# Patient Record
Sex: Female | Born: 1975 | Race: Asian | Hispanic: No | Marital: Married | Smoking: Never smoker
Health system: Southern US, Community
[De-identification: ages and names within clinical notes are randomized; demographics above are authoritative.]

## PROBLEM LIST (undated history)

## (undated) DIAGNOSIS — I10 Essential (primary) hypertension: Secondary | ICD-10-CM

## (undated) HISTORY — DX: Essential (primary) hypertension: I10

---

## 2004-08-09 ENCOUNTER — Ambulatory Visit (HOSPITAL_COMMUNITY): Admission: RE | Admit: 2004-08-09 | Discharge: 2004-08-09 | Payer: Self-pay | Admitting: Obstetrics and Gynecology

## 2004-10-23 ENCOUNTER — Inpatient Hospital Stay (HOSPITAL_COMMUNITY): Admission: AD | Admit: 2004-10-23 | Discharge: 2004-10-23 | Payer: Self-pay | Admitting: *Deleted

## 2004-10-24 ENCOUNTER — Inpatient Hospital Stay (HOSPITAL_COMMUNITY): Admission: AD | Admit: 2004-10-24 | Discharge: 2004-10-29 | Payer: Self-pay | Admitting: Obstetrics & Gynecology

## 2004-10-24 ENCOUNTER — Ambulatory Visit: Payer: Self-pay | Admitting: Obstetrics and Gynecology

## 2004-10-26 ENCOUNTER — Encounter (INDEPENDENT_AMBULATORY_CARE_PROVIDER_SITE_OTHER): Payer: Self-pay | Admitting: Specialist

## 2004-10-26 DIAGNOSIS — IMO0002 Reserved for concepts with insufficient information to code with codable children: Secondary | ICD-10-CM

## 2008-06-03 ENCOUNTER — Emergency Department (HOSPITAL_COMMUNITY): Admission: EM | Admit: 2008-06-03 | Discharge: 2008-06-03 | Payer: Self-pay | Admitting: Emergency Medicine

## 2010-04-22 ENCOUNTER — Inpatient Hospital Stay (HOSPITAL_COMMUNITY): Admission: AD | Admit: 2010-04-22 | Payer: Self-pay | Admitting: Obstetrics and Gynecology

## 2010-05-31 ENCOUNTER — Encounter (HOSPITAL_COMMUNITY)
Admission: RE | Admit: 2010-05-31 | Discharge: 2010-05-31 | Disposition: A | Discharge status: Home / Self Care | Payer: PRIVATE HEALTH INSURANCE | Source: Ambulatory Visit | Attending: Obstetrics and Gynecology | Admitting: Obstetrics and Gynecology

## 2010-05-31 DIAGNOSIS — Z01812 Encounter for preprocedural laboratory examination: Secondary | ICD-10-CM | POA: Insufficient documentation

## 2010-05-31 LAB — CBC
Hemoglobin: 10.2 g/dL — ABNORMAL LOW (ref 12.0–15.0)
MCH: 21.8 pg — ABNORMAL LOW (ref 26.0–34.0)
Platelets: 264 10*3/uL (ref 150–400)
RDW: 19.6 % — ABNORMAL HIGH (ref 11.5–15.5)

## 2010-05-31 LAB — RPR: RPR Ser Ql: NONREACTIVE

## 2010-06-01 LAB — SURGICAL PCR SCREEN: Staphylococcus aureus: NEGATIVE

## 2010-06-02 ENCOUNTER — Inpatient Hospital Stay (HOSPITAL_COMMUNITY)
Admission: AD | Admit: 2010-06-02 | Discharge: 2010-06-05 | DRG: 766 | Disposition: A | Discharge status: Home / Self Care | Payer: PRIVATE HEALTH INSURANCE | Source: Ambulatory Visit | Attending: Obstetrics and Gynecology | Admitting: Obstetrics and Gynecology

## 2010-06-02 ENCOUNTER — Other Ambulatory Visit: Payer: Self-pay | Admitting: Obstetrics and Gynecology

## 2010-06-02 DIAGNOSIS — O09529 Supervision of elderly multigravida, unspecified trimester: Secondary | ICD-10-CM | POA: Diagnosis present

## 2010-06-02 DIAGNOSIS — O34219 Maternal care for unspecified type scar from previous cesarean delivery: Secondary | ICD-10-CM | POA: Diagnosis present

## 2010-06-02 LAB — CBC
HCT: 32.7 % — ABNORMAL LOW (ref 36.0–46.0)
Hemoglobin: 10.1 g/dL — ABNORMAL LOW (ref 12.0–15.0)
MCV: 71.1 fL — ABNORMAL LOW (ref 78.0–100.0)
RBC: 4.6 MIL/uL (ref 3.87–5.11)
RDW: 19.9 % — ABNORMAL HIGH (ref 11.5–15.5)

## 2010-06-02 LAB — TYPE AND SCREEN: ABO/RH(D): B POS

## 2010-06-03 DIAGNOSIS — IMO0002 Reserved for concepts with insufficient information to code with codable children: Secondary | ICD-10-CM

## 2010-06-04 LAB — CBC
MCH: 21.7 pg — ABNORMAL LOW (ref 26.0–34.0)
RBC: 3.55 MIL/uL — ABNORMAL LOW (ref 3.87–5.11)
RDW: 19.8 % — ABNORMAL HIGH (ref 11.5–15.5)
WBC: 11.1 10*3/uL — ABNORMAL HIGH (ref 4.0–10.5)

## 2010-06-07 ENCOUNTER — Inpatient Hospital Stay
Admission: RE | Admit: 2010-06-07 | Payer: PRIVATE HEALTH INSURANCE | Source: Ambulatory Visit | Admitting: Obstetrics and Gynecology

## 2010-06-08 NOTE — Discharge Summary (Signed)
NAME:  Brittany Holt, Brittany Holt             ACCOUNT NO.:  [id number]  MEDICAL RECORD NO.:  [id number]           PATIENT TYPE:  I  LOCATION:  9115                          FACILITY:  [facility]  PHYSICIAN:  Sherron Monday, MD        DATE OF BIRTH:  Jun 12, 1975  DATE OF ADMISSION:  06/02/2010 DATE OF DISCHARGE:  06/05/2010                              DISCHARGE SUMMARY   ADMITTING DIAGNOSES:  Intrauterine pregnancy at 40 plus weeks in early labor.  The patient desires vaginal birth after cesarean after discussion of risks, benefits, and alternatives.  DISCHARGE DIAGNOSES:  Intrauterine pregnancy at 40 plus weeks in early labor.  The patient desires vaginal birth after cesarean after discussion of risks, benefits, and alternatives, delivered by repeat low transverse cesarean section.  PROCEDURE:  Repeat low transverse cesarean section.  HISTORY OF PRESENT ILLNESS:  A 35 year old G3, P1-0-1-1 at 40 plus weeks in early labor, desirous a VBAC after discussion of risks, benefits of VBAC versus repeat low section.  Her pregnancy is complicated by AMA and late prenatal care.  She states she has had good fetal movement, no loss of fluid, no vaginal bleeding, contractions every 4 minutes cervical change from recent office exam of 1.5 cm to 2.5 cm.  PAST MEDICAL HISTORY:  It is not significant.  SURGICAL HISTORY:  Significant for a cesarean section in 2006.  GYN HISTORY:  G1 was a low transverse cesarean section for fetal stress at 53 with G2 is the miscarriage.  G3 is the present pregnancy.  History of Trichomonas in the pregnancy.  No history of abnormal Pap smears.  MEDICATIONS:  Prenatal vitamins and Tylenol.  ALLERGIES:  No known drug allergies.  No latex allergies.  SOCIAL HISTORY:  Denies alcohol, tobacco, or drug use and is married.  FAMILY HISTORY:  Heart disease, diabetes, and hypertension and prenatal labs B+, antibody screen positive, Hemoglobin 9.7. Pap smear within normal limits with no  ECC, rubella immune, RPR nonreactive, hepatitis B surface antigen negative, HIV negative, platelets 235,000, Gonorrhea negative, Chlamydia negative.  Cystic fibrosis screen declined.  Glucola 125.  Group B strep was positive. Ultrasound dating the pregnancy was at 21 weeks revealing normal anatomy, female infant, posterior placenta.  On admission, she is afebrile with mildly elevated blood pressures, fetal heart tones are in the 140s and reactive.  She was noted to have an epidural p.r.n. and received penicillin for group B strep prophylaxis.  She received PIH labs was started on Pitocin per the low-dose protocol, rupture of membranes was planned 4 hours after penicillin.  She had one in the morning and no cervical change and was uncomfortable with contractions. d/w pt, baby was having variables with each contraction with this.  At this time, the patient requested a repeat cesarean section. Discussed with the patient risks, benefits, and alternatives, including but not limited to bleeding, infection, damage to the surrounding organs as well as trouble healing, questions answered.  The patient wished to proceed delivering a viable female infant at 2:01 a.m. on the June 03, 2010, with Apgars of 9 at 1 minute and 9 at 5 minutes with a  weight of 8 pounds 3 ounces.  EBL of 600 mL.  Her postpartum course was relatively uncomplicated.  She remained afebrile.  Vital signs stable throughout.  Hemoglobin decreased from 10.1 to 7.7 this was well tolerated.  She was discharged home on postoperative day #2 at patient's request, she will return in another week for staple removal discharged with Motrin, Percocet, and prenatal vitamins.  She is B+.  We will discuss contraception.  Her postpartum checkup.  She plans to both breast and bottle feed, rubella immune, hemoglobin 10.1 to 7.7.     Sherron Monday, MD     JB/MEDQ  D:  06/05/2010  T:  06/06/2010  Job:  [id number]  Electronically Signed by  Sherron Monday MD on 06/08/2010 05:26:06 PM

## 2010-06-08 NOTE — Op Note (Signed)
NAME:  Brittany Holt, Brittany Holt             ACCOUNT NO.:  [id number]  MEDICAL RECORD NO.:  [id number]           PATIENT TYPE:  I  LOCATION:  9115                          [facility]  [facility]  PHYSICIAN:  Sherron Monday, MD        DATE OF BIRTH:  01-23-1976  DATE OF PROCEDURE:  06/03/2010 DATE OF DISCHARGE:                              OPERATIVE REPORT   PREOPERATIVE DIAGNOSES:  Intrauterine pregnancy at 40 plus weeks, failed vaginal birth after cesarean, fetal intolerance of labor.  POSTOPERATIVE DIAGNOSES:  Intrauterine pregnancy at 40 plus weeks, failed vaginal birth after cesarean, fetal intolerance of labor and delivered.  PROCEDURE:  Repeat low transverse cesarean section.  SURGEON:  Sherron Monday, MD  ANESTHESIA:  Spinal.  FINDINGS:  Viable female infant at 2:01 a.m. with Apgars of 9 at 1 minute and 9 at 5 minutes and a weight of 8 pounds 3 ounces.  Normal uterus, tubes, and ovaries were noted.  EBL:  600 mL.  IV FLUIDS:  2000 mL.  URINE OUTPUT:  200 mL clear urine at the end of the procedure.  COMPLICATIONS:  None.  PATHOLOGY:  None.  DISPOSITION:  Stable to PACU.  PROCEDURE:  After informed consent was reviewed with the patient including risks, benefits, and alternatives of the surgical procedure, including but not limited to bleeding, infection, damage to the surrounding organs as well as trouble healing.  She was transported to the OR where spinal anesthesia was placed and found to be adequate.  She was then placed in a supine position with a leftward tilt, prepped and draped in a normal sterile fashion.  A Foley catheter was sterilely placed.  A Pfannenstiel incision was made at the level of her previous incision, carried through the underlying layer of fascia sharply.  The fascia was incised in the midline and the incision was extended laterally with Mayo scissors.  The inferior aspect of the fascial incision was grasped with Kocher clamps, elevated, and the  rectus muscles were dissected off both bluntly and sharply.  Attention was then turned to the superior portion which in a similar fashion was grasped with Kocher clamps, elevated and the rectus muscles were dissected off both bluntly and sharply.  Midline was easily identified.  Peritoneum was entered bluntly, and this incision was extended inferiorly and superiorly with good visualization of the bladder.  Alexis skin retractor was placed carefully making sure no bowel was entrapped. Some filmy anterior abdominal wall, uterus, and urine incisions were noted and lysed.  The vesicouterine peritoneum was identified, tented up with pickups and the bladder flap created digitally and sharply.  Incision was made in a transverse fashion. Infant was delivered from a vertex presentation.  Vacuum was applied to aid with delivery.  The infant was delivered from vertex presentation. Nose and mouth were suctioned.  Cord was clamped and cut.  Infant was handed off to the awaiting pediatric staff.  The placenta was expressed from the uterus.  Uterus cleared of all clots and debris.  The uterine incision was outlined with sponge sticks and closed in 2 layers of 0 Monocryl.  The first of which  is running locked, the second being the imbricating layer.  Pelvic survey and copious irrigation was performed, noting normal bilateral ovaries, tubes, and the bladder flap was made hemostatic with Bovie cautery and an additional suture of 3-0 Vicryl, and the Alexis skin retractor was removed.  The peritoneum was outlined with Kelly clamps, reapproximated using 2-0 Vicryl.  After inspection of the subfascial planes, the rectus muscles making hemostatic with Bovie cautery.  Fascia was reapproximated using 0 Vicryl in a running fashion. Subcuticular adipose layer was made hemostatic and irrigated.  The dead space was closed with 3-0 plain gut.  Skin was closed with staples.  The patient tolerated the procedure well.   Sponge, lap, and needle count was correct x2 at the end of procedure.     Sherron Monday, MD     JB/MEDQ  D:  06/03/2010  T:  06/03/2010  Job:  [id number]  Electronically Signed by Sherron Monday MD on 06/08/2010 05:24:23 PM

## 2010-06-22 LAB — URINALYSIS, ROUTINE W REFLEX MICROSCOPIC
Bilirubin Urine: NEGATIVE
Ketones, ur: NEGATIVE mg/dL
Leukocytes, UA: NEGATIVE
Nitrite: NEGATIVE
Protein, ur: 30 mg/dL — AB
Specific Gravity, Urine: 1.01 (ref 1.005–1.030)
Urine Glucose, Fasting: NEGATIVE mg/dL

## 2010-06-22 LAB — URINE MICROSCOPIC-ADD ON

## 2010-06-22 LAB — ABO/RH: ABO/RH(D): B POS

## 2010-08-08 LAB — CBC
Hemoglobin: 11.1 g/dL — ABNORMAL LOW (ref 12.0–15.0)
MCHC: 32.6 g/dL (ref 30.0–36.0)
MCV: 70.5 fL — ABNORMAL LOW (ref 78.0–100.0)
Platelets: 282 10*3/uL (ref 150–400)
WBC: 10.8 10*3/uL — ABNORMAL HIGH (ref 4.0–10.5)

## 2010-08-08 LAB — HCG, SERUM, QUALITATIVE: Preg, Serum: POSITIVE — AB

## 2010-08-08 LAB — HCG, QUANTITATIVE, PREGNANCY: hCG, Beta Chain, Quant, S: 203 m[IU]/mL — ABNORMAL HIGH (ref <5)

## 2010-08-08 LAB — WET PREP, GENITAL

## 2010-08-08 LAB — ABO/RH: ABO/RH(D): B POS

## 2010-09-08 NOTE — Discharge Summary (Signed)
NAME:  Brittany Holt, ADELSTEIN             ACCOUNT NO.:  [id number]   MEDICAL RECORD NO.:  [id number]          PATIENT TYPE:  INP   LOCATION:  9109                          [facility]  [facility]   PHYSICIAN:  Broadus John T. Pickard II, MDDATE OF BIRTH:  Oct 24, 1975   DATE OF ADMISSION:  10/24/2004  DATE OF DISCHARGE:  10/29/2004                                 DISCHARGE SUMMARY   ADMISSION DIAGNOSES:  1.  G1, P0 at 37-3/7 weeks.  2.  Breech presentation.  3.  Anemic with hemoglobin of 9.9.  4.  Preeclampsia.   DISCHARGE DIAGNOSES:  1.  Low transverse cesarean section secondary to nonreassuring fetal heart      rate.  2.  Status post successful version of a breech infant to a vertex position.  3.  Successful delivery of a full-term female infant.  4.  Anemia with a hemoglobin at discharge of 9.0.   LABORATORIES:  White count on admission is 8.4.  Hemoglobin on admission is  10, hematocrit 31.0, platelet count is 359.  Creatinine 0.8, alkaline  phosphatase is 240, AST is 22, ALT is 17.  Uric acid, however, is elevated  at 7.3 and LDH is 117.  A 24-hour urine protein on admission was 399.  A  repeat was 781.  Discharge white count is 9.5, hemoglobin is 9.0, hematocrit  is 27.7, platelet count of 231.  Uric acid trended from 7.3 to 6.7.  LDH  trended from 117 to 188.   PROCEDURES:  1.  Version of a breech infant performed by Dr. Penne Lash.  2.  Low transverse cesarean section performed by Dr. Okey Dupre on October 26, 2004 at      0145 hours.   HOSPITAL COURSE:  The patient is a 35 year old G1, P0 who is admitted at 32-  3/7 weeks based on an ultrasound with pregnancy-induced hypertension.  Source of prenatal care was [facility] with onset at 25 weeks.  The  patient was, however, in the breech presentation upon admission.  Please see  the H&P for further details with regards to her prenatal care.  The patient  was admitted and the risks and benefits of a version and the trial of labor  afterwards were  discussed with the patient.  The patient agreed to the  version after the risks and benefits were weighed and the version was  performed by Dr. Penne Lash on October 25, 2004.  Vertex position was confirmed by  ultrasound that day.  After conversion to vertex position, the patient's 24-  hour urine was obtained given her pregnancy-induced hypertension.  It was  determined to be in cervical ripening at that point.  After the beginning of  pitocin, the patient began to experience late decelerations.  The risks and  benefits of C-section were discussed with the patient and the patient  elected to continue watchful waiting; however, after restarting pitocin, the  baby began immediately having significant late decelerations.  The decision  was made to go for C-section.  C-section was performed on October 26, 2004 by  Dr. Okey Dupre.  The patient had a tight nuchal cord x2 around  the baby and a cord  over the shoulder.  The patient tolerated low transverse C-section without  complication with an estimated blood loss of 500 mL.  The patient was  continued on magnesium that was started on admission secondary to severe  preeclampsia.  On postoperative day #0, the magnesium was stopped secondary  to bradycardia with heart rate in the 50s.  At that point, the patient was  diuresing well.  The patient experienced routine postoperative care without  complications.  The decision was made to bottle feed the infant.  The  patient elected to receive oral contraceptive pills for contraception at  discharge.  The patient declined circumcision for the baby.  Her bleeding  was controlled at the time of discharge.  Her pain was also controlled at  the time of discharge with p.r.n. Motrin and Percocet.   DISCHARGE MEDICATIONS:  1.  Percocet 5/325, one p.o. q.4h. p.r.n.  2.  Motrin 600 mg p.o. q.6h. p.r.n. pain.  3.  Colace 100 mg p.o. b.i.d.  4.  Iron sulfate 325 mg p.o. t.i.d. for anemia.   FOLLOW-UP INSTRUCTIONS:  The  patient was instructed to follow up at the  [facility] in six weeks for routine postpartum check.  At the time of  discharge, the patient is diuresing without complications.  Blood pressure  is stable in the 120s/70s-80s and experiencing no sequelae of preeclampsia.       WTP/MEDQ  D:  10/29/2004  T:  10/29/2004  Job:  [id number]

## 2010-09-08 NOTE — Op Note (Signed)
NAME:  Brittany Holt, Brittany Holt             ACCOUNT NO.:  [id number]   MEDICAL RECORD NO.:  [id number]          PATIENT TYPE:  INP   LOCATION:  9146                          [facility]ILITY[facility]   PHYSICIAN:  Phil D. Okey Dupre, M.D.     DATE OF BIRTH:  [birth year]   DATE OF PROCEDURE:  10/26/2004  DATE OF DISCHARGE:                                 OPERATIVE REPORT   PROCEDURE:  Low transverse cesarean section.   PREOPERATIVE DIAGNOSIS:  Nonreassuring fetal heart rate tracing.   POSTOPERATIVE DIAGNOSIS:  Nonreassuring fetal heart rate tracing.  Plus very  tight nuchal cord x2, plus cord over shoulder, ie, cord entanglement.   SURGEON:  Javier Glazier. Okey Dupre, M.D.   ANESTHESIA:  Spinal.   ESTIMATED BLOOD LOSS:  500 mL.   SPECIMENS:  Placenta.   POSTOPERATIVE CONDITION:  Satisfactory.   FINDINGS:  Infant with Apgars of 8 and 9, female, cord pH 7.27.   INDICATIONS FOR PROCEDURE:  Primigravida who had been admitted to the  hospital because of preeclampsia, converted spontaneously from frank breech  to vertex on the day prior to surgery.  It was decided to start induction on  the eve prior to the cesarean section.  When Cytotec was used, the baby  immediately developed significant late decelerations with contractions.  The  Cytotec was removed.  The baby was allowed to recover and then low dose  Pitocin was started with the same result.  It was decided to go with the  cesarean section and the findings were as above.   DESCRIPTION OF PROCEDURE:  Under satisfactory spinal anesthesia with the  patient in the dorsal supine position, a Foley catheter in the urinary  bladder, the abdomen was prepped and draped in the usual sterile fashion.  Entered through a transverse Pfannenstiel incision situated 3 cm above the  symphysis pubis and extending for a total length of 16 cm.  The abdomen was  entered by layers and on entering the peritoneal cavity, the  visceroperitoneum and the anterior surface of the uterus was  opened  transversely by sharp dissection.  The bladder pushed away from the lower  uterine segment which was entered by blunt and sharp dissection.  The fluid  was immediately seen to be clear.  The baby was in an LOT presentation and  was easily delivered.  Upon delivery, the double nuchal cord plus  entanglement over the shoulder was noted.  The cord was doubly clamped and  divided.  The baby handed to pediatrician.  Sample of cord taken for  analysis.  The placenta spontaneously removed and the uterus explored and  closed with a continuous running locked 0 Vicryl suture on an atraumatic  needle.  This was continued to a second layer of an imbricating suture  because of some oozing along the suture line.  The area was dry following  this.  The uterus was firm, no active bleeding was noted.  The fascia was  closed with a continuous running 0 Vicryl on an atraumatic needle.  Subcutaneous bleeders were controlled with hot cautery.  Skin edges  approximated with skin  staples.  A dry sterile dressing was applied.  Tape, instrument, sponge, and  needle count were reported correct at the end of the procedure.  Foley  catheter was draining clear amber urine and the patient was transferred to  the recovery room in satisfactory condition.       PDR/MEDQ  D:  10/26/2004  T:  10/26/2004  Job:  [id number]

## 2014-09-19 ENCOUNTER — Emergency Department (HOSPITAL_COMMUNITY): Payer: PRIVATE HEALTH INSURANCE

## 2014-09-19 ENCOUNTER — Emergency Department (HOSPITAL_COMMUNITY)
Admission: EM | Admit: 2014-09-19 | Discharge: 2014-09-19 | Disposition: A | Discharge status: Home / Self Care | Payer: PRIVATE HEALTH INSURANCE | Attending: Emergency Medicine | Admitting: Emergency Medicine

## 2014-09-19 ENCOUNTER — Encounter (HOSPITAL_COMMUNITY): Payer: Self-pay | Admitting: *Deleted

## 2014-09-19 DIAGNOSIS — S301XXA Contusion of abdominal wall, initial encounter: Secondary | ICD-10-CM | POA: Insufficient documentation

## 2014-09-19 DIAGNOSIS — T7491XA Unspecified adult maltreatment, confirmed, initial encounter: Secondary | ICD-10-CM

## 2014-09-19 DIAGNOSIS — S62329A Displaced fracture of shaft of unspecified metacarpal bone, initial encounter for closed fracture: Secondary | ICD-10-CM

## 2014-09-19 DIAGNOSIS — Y999 Unspecified external cause status: Secondary | ICD-10-CM | POA: Insufficient documentation

## 2014-09-19 DIAGNOSIS — Y939 Activity, unspecified: Secondary | ICD-10-CM | POA: Insufficient documentation

## 2014-09-19 DIAGNOSIS — Y929 Unspecified place or not applicable: Secondary | ICD-10-CM | POA: Insufficient documentation

## 2014-09-19 DIAGNOSIS — S62323A Displaced fracture of shaft of third metacarpal bone, left hand, initial encounter for closed fracture: Secondary | ICD-10-CM | POA: Insufficient documentation

## 2014-09-19 DIAGNOSIS — S29001A Unspecified injury of muscle and tendon of front wall of thorax, initial encounter: Secondary | ICD-10-CM | POA: Insufficient documentation

## 2014-09-19 DIAGNOSIS — T07XXXA Unspecified multiple injuries, initial encounter: Secondary | ICD-10-CM

## 2014-09-19 DIAGNOSIS — S60222A Contusion of left hand, initial encounter: Secondary | ICD-10-CM | POA: Insufficient documentation

## 2014-09-19 MED ORDER — HYDROCODONE-ACETAMINOPHEN 5-325 MG PO TABS: 1.0000 | ORAL_TABLET | Freq: Four times a day (QID) | ORAL | Status: DC | PRN
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
## 2014-09-19 NOTE — Progress Notes (Signed)
Orthopedic Tech Progress Note Patient Details:  Franz DellMerlihder Epp 01/14/1976 [id number]  Ortho Devices Type of Ortho Device: Volar splint Ortho Device/Splint Interventions: Application   Cammer, Mickie BailJennifer Carol 09/19/2014, 3:21 AM
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
## 2014-09-27 ENCOUNTER — Ambulatory Visit (INDEPENDENT_AMBULATORY_CARE_PROVIDER_SITE_OTHER): Payer: Self-pay | Admitting: Physician Assistant

## 2014-09-27 VITALS — BP 116/72 | HR 73 | Temp 98.7°F | Resp 18 | Ht 60.5 in | Wt 165.0 lb

## 2014-09-27 DIAGNOSIS — S62329A Displaced fracture of shaft of unspecified metacarpal bone, initial encounter for closed fracture: Secondary | ICD-10-CM

## 2014-09-27 NOTE — Progress Notes (Deleted)
   Subjective:    Patient ID: Brittany Holt, female    DOB: 08/07/1975, 39 y.o.   MRN: [id number]  HPI    Review of Systems     Objective:   Physical Exam        Assessment & Plan:

## 2014-09-27 NOTE — Patient Instructions (Signed)
You should be contacted by ortho referrals.  Please see an orthopedist.  This injury can result in long-term pain, hand deformity, and mal-use of this extremity.
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
## 2015-06-13 LAB — CYTOLOGY - PAP: Pap: NEGATIVE

## 2015-06-13 LAB — OB RESULTS CONSOLE HIV ANTIBODY (ROUTINE TESTING): HIV: NONREACTIVE

## 2015-06-13 LAB — OB RESULTS CONSOLE GC/CHLAMYDIA
Chlamydia: POSITIVE
Gonorrhea: NEGATIVE

## 2015-06-13 LAB — OB RESULTS CONSOLE PLATELET COUNT: Platelets: 201 10*3/uL

## 2015-06-13 LAB — OB RESULTS CONSOLE ABO/RH: RH TYPE: POSITIVE

## 2015-06-13 LAB — OB RESULTS CONSOLE HEPATITIS B SURFACE ANTIGEN: HEP B S AG: NEGATIVE

## 2015-06-13 LAB — OB RESULTS CONSOLE ANTIBODY SCREEN: Antibody Screen: NEGATIVE

## 2015-06-13 LAB — OB RESULTS CONSOLE VARICELLA ZOSTER ANTIBODY, IGG: Varicella: IMMUNE

## 2015-06-13 LAB — OB RESULTS CONSOLE RUBELLA ANTIBODY, IGM: RUBELLA: NON-IMMUNE/NOT IMMUNE

## 2015-06-13 LAB — OB RESULTS CONSOLE HGB/HCT, BLOOD
HCT: 33 %
Hemoglobin: 10.8 g/dL

## 2015-06-13 LAB — OB RESULTS CONSOLE RPR: RPR: NONREACTIVE

## 2015-06-14 ENCOUNTER — Encounter (HOSPITAL_COMMUNITY): Payer: Self-pay | Admitting: *Deleted

## 2015-06-21 ENCOUNTER — Encounter (HOSPITAL_COMMUNITY): Payer: PRIVATE HEALTH INSURANCE

## 2015-06-22 ENCOUNTER — Ambulatory Visit (HOSPITAL_COMMUNITY): Payer: PRIVATE HEALTH INSURANCE

## 2015-06-24 ENCOUNTER — Encounter: Payer: Self-pay | Admitting: *Deleted

## 2015-06-24 DIAGNOSIS — O09529 Supervision of elderly multigravida, unspecified trimester: Secondary | ICD-10-CM

## 2015-06-24 DIAGNOSIS — O9989 Other specified diseases and conditions complicating pregnancy, childbirth and the puerperium: Secondary | ICD-10-CM

## 2015-06-24 DIAGNOSIS — O24419 Gestational diabetes mellitus in pregnancy, unspecified control: Secondary | ICD-10-CM

## 2015-06-24 DIAGNOSIS — O09899 Supervision of other high risk pregnancies, unspecified trimester: Secondary | ICD-10-CM | POA: Insufficient documentation

## 2015-06-24 DIAGNOSIS — Z283 Underimmunization status: Secondary | ICD-10-CM

## 2015-06-24 DIAGNOSIS — O0990 Supervision of high risk pregnancy, unspecified, unspecified trimester: Secondary | ICD-10-CM | POA: Insufficient documentation

## 2015-06-24 DIAGNOSIS — Z98891 History of uterine scar from previous surgery: Secondary | ICD-10-CM

## 2015-06-24 DIAGNOSIS — Z2839 Other underimmunization status: Secondary | ICD-10-CM

## 2015-06-24 DIAGNOSIS — O34219 Maternal care for unspecified type scar from previous cesarean delivery: Secondary | ICD-10-CM | POA: Insufficient documentation

## 2015-06-27 ENCOUNTER — Ambulatory Visit: Payer: PRIVATE HEALTH INSURANCE | Admitting: *Deleted

## 2015-06-27 ENCOUNTER — Encounter: Payer: Medicaid Other | Attending: Obstetrics and Gynecology | Admitting: *Deleted

## 2015-06-27 ENCOUNTER — Encounter: Payer: Self-pay | Admitting: *Deleted

## 2015-06-27 DIAGNOSIS — Z029 Encounter for administrative examinations, unspecified: Secondary | ICD-10-CM | POA: Diagnosis present

## 2015-06-27 DIAGNOSIS — O24419 Gestational diabetes mellitus in pregnancy, unspecified control: Secondary | ICD-10-CM

## 2015-06-27 NOTE — Progress Notes (Signed)
  Patient was seen on 06/27/15 for Gestational Diabetes self-management . The following learning objectives were met by the patient :   States the definition of Gestational Diabetes  States why dietary management is important in controlling blood glucose  Describes the effects of carbohydrates on blood glucose levels  Demonstrates ability to create a balanced meal plan  Demonstrates carbohydrate counting   States when to check blood glucose levels  Demonstrates proper blood glucose monitoring techniques  States the effect of stress and exercise on blood glucose levels  States the importance of limiting caffeine and abstaining from alcohol and smoking  Plan:  Consider  increasing your activity level by walking daily as tolerated Begin checking BG before breakfast and 2 hours after first bit of breakfast, lunch and dinner after  as directed by MD  Take medication  as directed by MD  Blood glucose monitor given: [employer] Lot # [id number] Exp: 2017-07-05 Blood glucose reading: 101 FBS  Patient instructed to monitor glucose levels: FBS: 60 - <90 2 hour: <120  Patient received the following handouts:  Nutrition Diabetes and Pregnancy  Carbohydrate Counting List  Meal Planning worksheet  Patient will be seen for follow-up as needed.

## 2015-06-27 NOTE — Progress Notes (Signed)
Nutrition Note: GDM Diet Education Pt has history of obesity. Pt has gained 22.6 # @ 4250w1d, which is > expected.  Pt reports eating 2 meals a day and 2 snacks. Pt is taking PNV.  Pt reports no N & V or heartburn. NKFA. Pt received verbal and written education on GDM nutrition during pregnancy.  Discussed weight gain goals of 11-20# for pregnancy.  Pt agrees to continue to take PNV.  Pt has WIC and plans to BF.  F/u in 4 weeks.  Carloyn Mannerebekah Zarielle Cea, MS, RD, LDN

## 2015-07-04 ENCOUNTER — Other Ambulatory Visit (HOSPITAL_COMMUNITY)
Admission: RE | Admit: 2015-07-04 | Discharge: 2015-07-04 | Disposition: A | Discharge status: Home / Self Care | Payer: Medicaid Other | Source: Ambulatory Visit | Attending: Obstetrics and Gynecology | Admitting: Obstetrics and Gynecology

## 2015-07-04 ENCOUNTER — Encounter: Payer: Self-pay | Admitting: Obstetrics and Gynecology

## 2015-07-04 ENCOUNTER — Ambulatory Visit (INDEPENDENT_AMBULATORY_CARE_PROVIDER_SITE_OTHER): Payer: Medicaid Other | Admitting: Obstetrics and Gynecology

## 2015-07-04 VITALS — BP 131/76 | HR 67 | Wt 204.1 lb

## 2015-07-04 DIAGNOSIS — O98819 Other maternal infectious and parasitic diseases complicating pregnancy, unspecified trimester: Secondary | ICD-10-CM

## 2015-07-04 DIAGNOSIS — Z113 Encounter for screening for infections with a predominantly sexual mode of transmission: Secondary | ICD-10-CM | POA: Diagnosis not present

## 2015-07-04 DIAGNOSIS — Z98891 History of uterine scar from previous surgery: Secondary | ICD-10-CM

## 2015-07-04 DIAGNOSIS — O24419 Gestational diabetes mellitus in pregnancy, unspecified control: Secondary | ICD-10-CM

## 2015-07-04 DIAGNOSIS — A749 Chlamydial infection, unspecified: Secondary | ICD-10-CM | POA: Insufficient documentation

## 2015-07-04 DIAGNOSIS — O98813 Other maternal infectious and parasitic diseases complicating pregnancy, third trimester: Secondary | ICD-10-CM

## 2015-07-04 DIAGNOSIS — O0993 Supervision of high risk pregnancy, unspecified, third trimester: Secondary | ICD-10-CM

## 2015-07-04 DIAGNOSIS — O34219 Maternal care for unspecified type scar from previous cesarean delivery: Secondary | ICD-10-CM | POA: Diagnosis not present

## 2015-07-04 DIAGNOSIS — O9989 Other specified diseases and conditions complicating pregnancy, childbirth and the puerperium: Secondary | ICD-10-CM

## 2015-07-04 DIAGNOSIS — O09523 Supervision of elderly multigravida, third trimester: Secondary | ICD-10-CM | POA: Diagnosis not present

## 2015-07-04 DIAGNOSIS — Z2839 Other underimmunization status: Secondary | ICD-10-CM

## 2015-07-04 DIAGNOSIS — O98313 Other infections with a predominantly sexual mode of transmission complicating pregnancy, third trimester: Secondary | ICD-10-CM | POA: Diagnosis not present

## 2015-07-04 DIAGNOSIS — Z283 Underimmunization status: Secondary | ICD-10-CM

## 2015-07-04 LAB — POCT URINALYSIS DIP (DEVICE)
Bilirubin Urine: NEGATIVE
Glucose, UA: NEGATIVE mg/dL
KETONES UR: NEGATIVE mg/dL
Leukocytes, UA: NEGATIVE
Nitrite: NEGATIVE
PH: 6.5 (ref 5.0–8.0)
PROTEIN: 100 mg/dL — AB
Specific Gravity, Urine: 1.02 (ref 1.005–1.030)
Urobilinogen, UA: 0.2 mg/dL (ref 0.0–1.0)

## 2015-07-04 LAB — OB RESULTS CONSOLE GBS: STREP GROUP B AG: NEGATIVE

## 2015-07-04 NOTE — Progress Notes (Signed)
Pt did not bring glucometer nor log book with her today. States this AM FBS 88mg /dl. And Highest 2hpp 150mg /dl, uncertain of other readings. Will return in one week withglucometer and log book for review of readings.

## 2015-07-04 NOTE — Progress Notes (Signed)
   Subjective:    Brittany Holt is a [id number] 3833w1d being seen today for her first obstetrical visit.  Her obstetrical history is significant for advanced maternal age and 2 previous cesarean section, chlamydia infection in 3 rd trimester. Patient does intend to breast feed. Pregnancy history fully reviewed.  Patient reports no complaints.  Filed Vitals:   07/04/15 1018  BP: 131/76  Pulse: 67  Weight: 204 lb 1.6 oz (92.579 kg)    HISTORY: OB History  Gravida Para Term Preterm AB SAB TAB Ectopic Multiple Living  5 2 2  0 2 2 0 0 0 2    # Outcome Date GA Lbr Len/2nd Weight Sex Delivery Anes PTL Lv  5 Current           4 Term 06/03/10 6567w0d  8 lb 3 oz (3.714 kg) F CS-Unspec Spinal N Y     Complications: Non-reassuring fetal heart rate or rhythm affecting management of fetus  3 Term 10/26/04 3368w0d  5 lb 8 oz (2.495 kg) M CS-Unspec Spinal N Y     Complications: Non-reassuring fetal heart rate or rhythm affecting management of fetus  2 SAB           1 SAB              Past Medical History  Diagnosis Date  . Hypertension    Past Surgical History  Procedure Laterality Date  . Cesarean section      x 2   Family History  Problem Relation Age of Onset  . Diabetes Father   . Heart disease Father   . Hypertension Father   . Hypertension Mother      Exam    Uterus:     Pelvic Exam:    Perineum: No Hemorrhoids, Hemorrhoids   Vulva: normal   Vagina:  normal mucosa, normal discharge   pH:    Cervix: closed, thick, posterior   Adnexa: not evaluated   Bony Pelvis: gynecoid  System: Breast:  normal appearance, no masses or tenderness   Skin: normal coloration and turgor, no rashes    Neurologic: oriented, no focal deficits   Extremities: normal strength, tone, and muscle mass   HEENT extra ocular movement intact   Mouth/Teeth mucous membranes moist, pharynx normal without lesions and dental hygiene good   Neck supple and no masses   Cardiovascular: regular rate and  rhythm   Respiratory:  chest clear, no wheezing, crepitations, rhonchi, normal symmetric air entry   Abdomen: soft, gravid   Urinary:       Assessment:    Pregnancy: [id number] Patient Active Problem List   Diagnosis Date Noted  . Chlamydia infection affecting pregnancy, antepartum 07/04/2015  . Supervision of high-risk pregnancy 06/24/2015  . Gestational diabetes mellitus (GDM) 06/24/2015  . AMA (advanced maternal age) multigravida 35+ 06/24/2015  . Rubella non-immune status, antepartum 06/24/2015  . History of C-section 06/24/2015        Plan:     Initial labs drawn. Prenatal vitamins. Problem list reviewed and updated. Genetic Screening discussed : declined.  Ultrasound discussed; fetal survey: results reviewed. Discussed risks associated with poorly controlled diabetes including but not limited risks of fetal macrosomia, IUFD, neonatal hypoglycemia, and possible neonatal death. Patient has not been checking her CBGs. Patient to see diabetic educator today for further education  Follow up in 1 weeks. 50% of 30 min visit spent on counseling and coordination of care.     Natan Hartog 07/04/2015

## 2015-07-04 NOTE — Patient Instructions (Signed)
Third Trimester of Pregnancy The third trimester is from week 29 through week 42, months 7 through 9. The third trimester is a time when the fetus is growing rapidly. At the end of the ninth month, the fetus is about 20 inches in length and weighs 6-10 pounds.  BODY CHANGES Your body goes through many changes during pregnancy. The changes vary from woman to woman.   Your weight will continue to increase. You can expect to gain 25-35 pounds (11-16 kg) by the end of the pregnancy.  You may begin to get stretch marks on your hips, abdomen, and breasts.  You may urinate more often because the fetus is moving lower into your pelvis and pressing on your bladder.  You may develop or continue to have heartburn as a result of your pregnancy.  You may develop constipation because certain hormones are causing the muscles that push waste through your intestines to slow down.  You may develop hemorrhoids or swollen, bulging veins (varicose veins).  You may have pelvic pain because of the weight gain and pregnancy hormones relaxing your joints between the bones in your pelvis. Backaches may result from overexertion of the muscles supporting your posture.  You may have changes in your hair. These can include thickening of your hair, rapid growth, and changes in texture. Some women also have hair loss during or after pregnancy, or hair that feels dry or thin. Your hair will most likely return to normal after your baby is born.  Your breasts will continue to grow and be tender. A yellow discharge may leak from your breasts called colostrum.  Your belly button may stick out.  You may feel short of breath because of your expanding uterus.  You may notice the fetus "dropping," or moving lower in your abdomen.  You may have a bloody mucus discharge. This usually occurs a few days to a week before labor begins.  Your cervix becomes thin and soft (effaced) near your due date. WHAT TO EXPECT AT YOUR  PRENATAL EXAMS  You will have prenatal exams every 2 weeks until week 36. Then, you will have weekly prenatal exams. During a routine prenatal visit:  You will be weighed to make sure you and the fetus are growing normally.  Your blood pressure is taken.  Your abdomen will be measured to track your baby's growth.  The fetal heartbeat will be listened to.  Any test results from the previous visit will be discussed.  You may have a cervical check near your due date to see if you have effaced. At around 36 weeks, your caregiver will check your cervix. At the same time, your caregiver will also perform a test on the secretions of the vaginal tissue. This test is to determine if a type of bacteria, Group B streptococcus, is present. Your caregiver will explain this further. Your caregiver may ask you:  What your birth plan is.  How you are feeling.  If you are feeling the baby move.  If you have had any abnormal symptoms, such as leaking fluid, bleeding, severe headaches, or abdominal cramping.  If you are using any tobacco products, including cigarettes, chewing tobacco, and electronic cigarettes.  If you have any questions. Other tests or screenings that may be performed during your third trimester include:  Blood tests that check for low iron levels (anemia).  Fetal testing to check the health, activity level, and growth of the fetus. Testing is done if you have certain medical conditions or if   there are problems during the pregnancy.  HIV (human immunodeficiency virus) testing. If you are at high risk, you may be screened for HIV during your third trimester of pregnancy. FALSE LABOR You may feel small, irregular contractions that eventually go away. These are called Braxton Hicks contractions, or false labor. Contractions may last for hours, days, or even weeks before true labor sets in. If contractions come at regular intervals, intensify, or become painful, it is best to be seen  by your caregiver.  SIGNS OF LABOR   Menstrual-like cramps.  Contractions that are 5 minutes apart or less.  Contractions that start on the top of the uterus and spread down to the lower abdomen and back.  A sense of increased pelvic pressure or back pain.  A watery or bloody mucus discharge that comes from the vagina. If you have any of these signs before the 37th week of pregnancy, call your caregiver right away. You need to go to the hospital to get checked immediately. HOME CARE INSTRUCTIONS   Avoid all smoking, herbs, alcohol, and unprescribed drugs. These chemicals affect the formation and growth of the baby.  Do not use any tobacco products, including cigarettes, chewing tobacco, and electronic cigarettes. If you need help quitting, ask your health care provider. You may receive counseling support and other resources to help you quit.  Follow your caregiver's instructions regarding medicine use. There are medicines that are either safe or unsafe to take during pregnancy.  Exercise only as directed by your caregiver. Experiencing uterine cramps is a good sign to stop exercising.  Continue to eat regular, healthy meals.  Wear a good support bra for breast tenderness.  Do not use hot tubs, steam rooms, or saunas.  Wear your seat belt at all times when driving.  Avoid raw meat, uncooked cheese, cat litter boxes, and soil used by cats. These carry germs that can cause birth defects in the baby.  Take your prenatal vitamins.  Take 1500-2000 mg of calcium daily starting at the 20th week of pregnancy until you deliver your baby.  Try taking a stool softener (if your caregiver approves) if you develop constipation. Eat more high-fiber foods, such as fresh vegetables or fruit and whole grains. Drink plenty of fluids to keep your urine clear or pale yellow.  Take warm sitz baths to soothe any pain or discomfort caused by hemorrhoids. Use hemorrhoid cream if your caregiver  approves.  If you develop varicose veins, wear support hose. Elevate your feet for 15 minutes, 3-4 times a day. Limit salt in your diet.  Avoid heavy lifting, wear low heal shoes, and practice good posture.  Rest a lot with your legs elevated if you have leg cramps or low back pain.  Visit your dentist if you have not gone during your pregnancy. Use a soft toothbrush to brush your teeth and be gentle when you floss.  A sexual relationship may be continued unless your caregiver directs you otherwise.  Do not travel far distances unless it is absolutely necessary and only with the approval of your caregiver.  Take prenatal classes to understand, practice, and ask questions about the labor and delivery.  Make a trial run to the hospital.  Pack your hospital bag.  Prepare the baby's nursery.  Continue to go to all your prenatal visits as directed by your caregiver. SEEK MEDICAL CARE IF:  You are unsure if you are in labor or if your water has broken.  You have dizziness.  You have   mild pelvic cramps, pelvic pressure, or nagging pain in your abdominal area.  You have persistent nausea, vomiting, or diarrhea.  You have a bad smelling vaginal discharge.  You have pain with urination. SEEK IMMEDIATE MEDICAL CARE IF:   You have a fever.  You are leaking fluid from your vagina.  You have spotting or bleeding from your vagina.  You have severe abdominal cramping or pain.  You have rapid weight loss or gain.  You have shortness of breath with chest pain.  You notice sudden or extreme swelling of your face, hands, ankles, feet, or legs.  You have not felt your baby move in over an hour.  You have severe headaches that do not go away with medicine.  You have vision changes.   This information is not intended to replace advice given to you by your health care provider. Make sure you discuss any questions you have with your health care provider.   Document Released:  04/03/2001 Document Revised: 04/30/2014 Document Reviewed: 06/10/2012 Elsevier Interactive Patient Education 2016 [employer]  Contraception Choices Contraception (birth control) is the use of any methods or devices to prevent pregnancy. Below are some methods to help avoid pregnancy. HORMONAL METHODS   Contraceptive implant. This is a thin, plastic tube containing progesterone hormone. It does not contain estrogen hormone. Your health care provider inserts the tube in the inner part of the upper arm. The tube can remain in place for up to 3 years. After 3 years, the implant must be removed. The implant prevents the ovaries from releasing an egg (ovulation), thickens the cervical mucus to prevent sperm from entering the uterus, and thins the lining of the inside of the uterus.  Progesterone-only injections. These injections are given every 3 months by your health care provider to prevent pregnancy. This synthetic progesterone hormone stops the ovaries from releasing eggs. It also thickens cervical mucus and changes the uterine lining. This makes it harder for sperm to survive in the uterus.  Birth control pills. These pills contain estrogen and progesterone hormone. They work by preventing the ovaries from releasing eggs (ovulation). They also cause the cervical mucus to thicken, preventing the sperm from entering the uterus. Birth control pills are prescribed by a health care provider.Birth control pills can also be used to treat heavy periods.  Minipill. This type of birth control pill contains only the progesterone hormone. They are taken every day of each month and must be prescribed by your health care provider.  Birth control patch. The patch contains hormones similar to those in birth control pills. It must be changed once a week and is prescribed by a health care provider.  Vaginal ring. The ring contains hormones similar to those in birth control pills. It is left in the vagina for 3  weeks, removed for 1 week, and then a new one is put back in place. The patient must be comfortable inserting and removing the ring from the vagina.A health care provider's prescription is necessary.  Emergency contraception. Emergency contraceptives prevent pregnancy after unprotected sexual intercourse. This pill can be taken right after sex or up to 5 days after unprotected sex. It is most effective the sooner you take the pills after having sexual intercourse. Most emergency contraceptive pills are available without a prescription. Check with your pharmacist. Do not use emergency contraception as your only form of birth control. BARRIER METHODS   Female condom. This is a thin sheath (latex or rubber) that is worn over the penis   during sexual intercourse. It can be used with spermicide to increase effectiveness.  Female condom. This is a soft, loose-fitting sheath that is put into the vagina before sexual intercourse.  Diaphragm. This is a soft, latex, dome-shaped barrier that must be fitted by a health care provider. It is inserted into the vagina, along with a spermicidal jelly. It is inserted before intercourse. The diaphragm should be left in the vagina for 6 to 8 hours after intercourse.  Cervical cap. This is a round, soft, latex or plastic cup that fits over the cervix and must be fitted by a health care provider. The cap can be left in place for up to 48 hours after intercourse.  Sponge. This is a soft, circular piece of polyurethane foam. The sponge has spermicide in it. It is inserted into the vagina after wetting it and before sexual intercourse.  Spermicides. These are chemicals that kill or block sperm from entering the cervix and uterus. They come in the form of creams, jellies, suppositories, foam, or tablets. They do not require a prescription. They are inserted into the vagina with an applicator before having sexual intercourse. The process must be repeated every time you have  sexual intercourse. INTRAUTERINE CONTRACEPTION  Intrauterine device (IUD). This is a T-shaped device that is put in a woman's uterus during a menstrual period to prevent pregnancy. There are 2 types:  Copper IUD. This type of IUD is wrapped in copper wire and is placed inside the uterus. Copper makes the uterus and fallopian tubes produce a fluid that kills sperm. It can stay in place for 10 years.  Hormone IUD. This type of IUD contains the hormone progestin (synthetic progesterone). The hormone thickens the cervical mucus and prevents sperm from entering the uterus, and it also thins the uterine lining to prevent implantation of a fertilized egg. The hormone can weaken or kill the sperm that get into the uterus. It can stay in place for 3-5 years, depending on which type of IUD is used. PERMANENT METHODS OF CONTRACEPTION  Female tubal ligation. This is when the woman's fallopian tubes are surgically sealed, tied, or blocked to prevent the egg from traveling to the uterus.  Hysteroscopic sterilization. This involves placing a small coil or insert into each fallopian tube. Your doctor uses a technique called hysteroscopy to do the procedure. The device causes scar tissue to form. This results in permanent blockage of the fallopian tubes, so the sperm cannot fertilize the egg. It takes about 3 months after the procedure for the tubes to become blocked. You must use another form of birth control for these 3 months.  Female sterilization. This is when the female has the tubes that carry sperm tied off (vasectomy).This blocks sperm from entering the vagina during sexual intercourse. After the procedure, the man can still ejaculate fluid (semen). NATURAL PLANNING METHODS  Natural family planning. This is not having sexual intercourse or using a barrier method (condom, diaphragm, cervical cap) on days the woman could become pregnant.  Calendar method. This is keeping track of the length of each menstrual  cycle and identifying when you are fertile.  Ovulation method. This is avoiding sexual intercourse during ovulation.  Symptothermal method. This is avoiding sexual intercourse during ovulation, using a thermometer and ovulation symptoms.  Post-ovulation method. This is timing sexual intercourse after you have ovulated. Regardless of which type or method of contraception you choose, it is important that you use condoms to protect against the transmission of sexually transmitted infections (  STIs). Talk with your health care provider about which form of contraception is most appropriate for you.   This information is not intended to replace advice given to you by your health care provider. Make sure you discuss any questions you have with your health care provider.   Document Released: 04/09/2005 Document Revised: 04/14/2013 Document Reviewed: 10/02/2012 Elsevier Interactive Patient Education 2016 [employer]   Breastfeeding Deciding to breastfeed is one of the best choices you can make for you and your baby. A change in hormones during pregnancy causes your breast tissue to grow and increases the number and size of your milk ducts. These hormones also allow proteins, sugars, and fats from your blood supply to make breast milk in your milk-producing glands. Hormones prevent breast milk from being released before your baby is born as well as prompt milk flow after birth. Once breastfeeding has begun, thoughts of your baby, as well as his or her sucking or crying, can stimulate the release of milk from your milk-producing glands.  BENEFITS OF BREASTFEEDING For Your Baby  Your first milk (colostrum) helps your baby's digestive system function better.  There are antibodies in your milk that help your baby fight off infections.  Your baby has a lower incidence of asthma, allergies, and sudden infant death syndrome.  The nutrients in breast milk are better for your baby than infant formulas and  are designed uniquely for your baby's needs.  Breast milk improves your baby's brain development.  Your baby is less likely to develop other conditions, such as childhood obesity, asthma, or type 2 diabetes mellitus. For You  Breastfeeding helps to create a very special bond between you and your baby.  Breastfeeding is convenient. Breast milk is always available at the correct temperature and costs nothing.  Breastfeeding helps to burn calories and helps you lose the weight gained during pregnancy.  Breastfeeding makes your uterus contract to its prepregnancy size faster and slows bleeding (lochia) after you give birth.   Breastfeeding helps to lower your risk of developing type 2 diabetes mellitus, osteoporosis, and breast or ovarian cancer later in life. SIGNS THAT YOUR BABY IS HUNGRY Early Signs of Hunger  Increased alertness or activity.  Stretching.  Movement of the head from side to side.  Movement of the head and opening of the mouth when the corner of the mouth or cheek is stroked (rooting).  Increased sucking sounds, smacking lips, cooing, sighing, or squeaking.  Hand-to-mouth movements.  Increased sucking of fingers or hands. Late Signs of Hunger  Fussing.  Intermittent crying. Extreme Signs of Hunger Signs of extreme hunger will require calming and consoling before your baby will be able to breastfeed successfully. Do not wait for the following signs of extreme hunger to occur before you initiate breastfeeding:  Restlessness.  A loud, strong cry.  Screaming. BREASTFEEDING BASICS Breastfeeding Initiation  Find a comfortable place to sit or lie down, with your neck and back well supported.  Place a pillow or rolled up blanket under your baby to bring him or her to the level of your breast (if you are seated). Nursing pillows are specially designed to help support your arms and your baby while you breastfeed.  Make sure that your baby's abdomen is facing  your abdomen.  Gently massage your breast. With your fingertips, massage from your chest wall toward your nipple in a circular motion. This encourages milk flow. You may need to continue this action during the feeding if your milk flows slowly.    Support your breast with 4 fingers underneath and your thumb above your nipple. Make sure your fingers are well away from your nipple and your baby's mouth.  Stroke your baby's lips gently with your finger or nipple.  When your baby's mouth is open wide enough, quickly bring your baby to your breast, placing your entire nipple and as much of the colored area around your nipple (areola) as possible into your baby's mouth.  More areola should be visible above your baby's upper lip than below the lower lip.  Your baby's tongue should be between his or her lower gum and your breast.  Ensure that your baby's mouth is correctly positioned around your nipple (latched). Your baby's lips should create a seal on your breast and be turned out (everted).  It is common for your baby to suck about 2-3 minutes in order to start the flow of breast milk. Latching Teaching your baby how to latch on to your breast properly is very important. An improper latch can cause nipple pain and decreased milk supply for you and poor weight gain in your baby. Also, if your baby is not latched onto your nipple properly, he or she may swallow some air during feeding. This can make your baby fussy. Burping your baby when you switch breasts during the feeding can help to get rid of the air. However, teaching your baby to latch on properly is still the best way to prevent fussiness from swallowing air while breastfeeding. Signs that your baby has successfully latched on to your nipple:  Silent tugging or silent sucking, without causing you pain.  Swallowing heard between every 3-4 sucks.  Muscle movement above and in front of his or her ears while sucking. Signs that your baby has  not successfully latched on to nipple:  Sucking sounds or smacking sounds from your baby while breastfeeding.  Nipple pain. If you think your baby has not latched on correctly, slip your finger into the corner of your baby's mouth to break the suction and place it between your baby's gums. Attempt breastfeeding initiation again. Signs of Successful Breastfeeding Signs from your baby:  A gradual decrease in the number of sucks or complete cessation of sucking.  Falling asleep.  Relaxation of his or her body.  Retention of a small amount of milk in his or her mouth.  Letting go of your breast by himself or herself. Signs from you:  Breasts that have increased in firmness, weight, and size 1-3 hours after feeding.  Breasts that are softer immediately after breastfeeding.  Increased milk volume, as well as a change in milk consistency and color by the fifth day of breastfeeding.  Nipples that are not sore, cracked, or bleeding. Signs That Your Baby is Getting Enough Milk  Wetting at least 3 diapers in a 24-hour period. The urine should be clear and pale yellow by age 5 days.  At least 3 stools in a 24-hour period by age 5 days. The stool should be soft and yellow.  At least 3 stools in a 24-hour period by age 7 days. The stool should be seedy and yellow.  No loss of weight greater than 10% of birth weight during the first 3 days of age.  Average weight gain of 4-7 ounces (113-198 g) per week after age 4 days.  Consistent daily weight gain by age 5 days, without weight loss after the age of 2 weeks. After a feeding, your baby may spit up a small amount. This   is common. BREASTFEEDING FREQUENCY AND DURATION Frequent feeding will help you make more milk and can prevent sore nipples and breast engorgement. Breastfeed when you feel the need to reduce the fullness of your breasts or when your baby shows signs of hunger. This is called "breastfeeding on demand." Avoid introducing a  pacifier to your baby while you are working to establish breastfeeding (the first 4-6 weeks after your baby is born). After this time you may choose to use a pacifier. Research has shown that pacifier use during the first year of a baby's life decreases the risk of sudden infant death syndrome (SIDS). Allow your baby to feed on each breast as long as he or she wants. Breastfeed until your baby is finished feeding. When your baby unlatches or falls asleep while feeding from the first breast, offer the second breast. Because newborns are often sleepy in the first few weeks of life, you may need to awaken your baby to get him or her to feed. Breastfeeding times will vary from baby to baby. However, the following rules can serve as a guide to help you ensure that your baby is properly fed:  Newborns (babies 4 weeks of age or younger) may breastfeed every 1-3 hours.  Newborns should not go longer than 3 hours during the day or 5 hours during the night without breastfeeding.  You should breastfeed your baby a minimum of 8 times in a 24-hour period until you begin to introduce solid foods to your baby at around 6 months of age. BREAST MILK PUMPING Pumping and storing breast milk allows you to ensure that your baby is exclusively fed your breast milk, even at times when you are unable to breastfeed. This is especially important if you are going back to work while you are still breastfeeding or when you are not able to be present during feedings. Your lactation consultant can give you guidelines on how long it is safe to store breast milk. A breast pump is a machine that allows you to pump milk from your breast into a sterile bottle. The pumped breast milk can then be stored in a refrigerator or freezer. Some breast pumps are operated by hand, while others use electricity. Ask your lactation consultant which type will work best for you. Breast pumps can be purchased, but some hospitals and breastfeeding support  groups lease breast pumps on a monthly basis. A lactation consultant can teach you how to hand express breast milk, if you prefer not to use a pump. CARING FOR YOUR BREASTS WHILE YOU BREASTFEED Nipples can become dry, cracked, and sore while breastfeeding. The following recommendations can help keep your breasts moisturized and healthy:  Avoid using soap on your nipples.  Wear a supportive bra. Although not required, special nursing bras and tank tops are designed to allow access to your breasts for breastfeeding without taking off your entire bra or top. Avoid wearing underwire-style bras or extremely tight bras.  Air dry your nipples for 3-4minutes after each feeding.  Use only cotton bra pads to absorb leaked breast milk. Leaking of breast milk between feedings is normal.  Use lanolin on your nipples after breastfeeding. Lanolin helps to maintain your skin's normal moisture barrier. If you use pure lanolin, you do not need to wash it off before feeding your baby again. Pure lanolin is not toxic to your baby. You may also hand express a few drops of breast milk and gently massage that milk into your nipples and allow the milk   to air dry. In the first few weeks after giving birth, some women experience extremely full breasts (engorgement). Engorgement can make your breasts feel heavy, warm, and tender to the touch. Engorgement peaks within 3-5 days after you give birth. The following recommendations can help ease engorgement:  Completely empty your breasts while breastfeeding or pumping. You may want to start by applying warm, moist heat (in the shower or with warm water-soaked hand towels) just before feeding or pumping. This increases circulation and helps the milk flow. If your baby does not completely empty your breasts while breastfeeding, pump any extra milk after he or she is finished.  Wear a snug bra (nursing or regular) or tank top for 1-2 days to signal your body to slightly decrease  milk production.  Apply ice packs to your breasts, unless this is too uncomfortable for you.  Make sure that your baby is latched on and positioned properly while breastfeeding. If engorgement persists after 48 hours of following these recommendations, contact your health care provider or a lactation consultant. OVERALL HEALTH CARE RECOMMENDATIONS WHILE BREASTFEEDING  Eat healthy foods. Alternate between meals and snacks, eating 3 of each per day. Because what you eat affects your breast milk, some of the foods may make your baby more irritable than usual. Avoid eating these foods if you are sure that they are negatively affecting your baby.  Drink milk, fruit juice, and water to satisfy your thirst (about 10 glasses a day).  Rest often, relax, and continue to take your prenatal vitamins to prevent fatigue, stress, and anemia.  Continue breast self-awareness checks.  Avoid chewing and smoking tobacco. Chemicals from cigarettes that pass into breast milk and exposure to secondhand smoke may harm your baby.  Avoid alcohol and drug use, including marijuana. Some medicines that may be harmful to your baby can pass through breast milk. It is important to ask your health care provider before taking any medicine, including all over-the-counter and prescription medicine as well as vitamin and herbal supplements. It is possible to become pregnant while breastfeeding. If birth control is desired, ask your health care provider about options that will be safe for your baby. SEEK MEDICAL CARE IF:  You feel like you want to stop breastfeeding or have become frustrated with breastfeeding.  You have painful breasts or nipples.  Your nipples are cracked or bleeding.  Your breasts are red, tender, or warm.  You have a swollen area on either breast.  You have a fever or chills.  You have nausea or vomiting.  You have drainage other than breast milk from your nipples.  Your breasts do not become  full before feedings by the fifth day after you give birth.  You feel sad and depressed.  Your baby is too sleepy to eat well.  Your baby is having trouble sleeping.   Your baby is wetting less than 3 diapers in a 24-hour period.  Your baby has less than 3 stools in a 24-hour period.  Your baby's skin or the white part of his or her eyes becomes yellow.   Your baby is not gaining weight by 5 days of age. SEEK IMMEDIATE MEDICAL CARE IF:  Your baby is overly tired (lethargic) and does not want to wake up and feed.  Your baby develops an unexplained fever.   This information is not intended to replace advice given to you by your health care provider. Make sure you discuss any questions you have with your health care provider.     Document Released: 04/09/2005 Document Revised: 12/29/2014 Document Reviewed: 10/01/2012 [employer] Interactive Patient Education 2016 [employer]  

## 2015-07-05 ENCOUNTER — Other Ambulatory Visit: Payer: Self-pay

## 2015-07-05 ENCOUNTER — Other Ambulatory Visit: Payer: Self-pay | Admitting: Obstetrics and Gynecology

## 2015-07-05 DIAGNOSIS — O98813 Other maternal infectious and parasitic diseases complicating pregnancy, third trimester: Principal | ICD-10-CM

## 2015-07-05 DIAGNOSIS — A749 Chlamydial infection, unspecified: Secondary | ICD-10-CM

## 2015-07-05 LAB — GC/CHLAMYDIA PROBE AMP (~~LOC~~) NOT AT ARMC
CHLAMYDIA, DNA PROBE: POSITIVE — AB
NEISSERIA GONORRHEA: NEGATIVE

## 2015-07-05 MED ORDER — AZITHROMYCIN 500 MG PO TABS: 1000.0000 mg | ORAL_TABLET | Freq: Once | ORAL | Status: DC

## 2015-07-05 NOTE — Telephone Encounter (Signed)
Pt informed of chamlydia infection and will pick up prescription. She is aware she needs to tell her partner to be treated.

## 2015-07-06 LAB — CULTURE, BETA STREP (GROUP B ONLY)

## 2015-07-11 ENCOUNTER — Ambulatory Visit (INDEPENDENT_AMBULATORY_CARE_PROVIDER_SITE_OTHER): Payer: Medicaid Other | Admitting: Family Medicine

## 2015-07-11 ENCOUNTER — Ambulatory Visit (HOSPITAL_COMMUNITY)
Admission: RE | Admit: 2015-07-11 | Discharge: 2015-07-11 | Disposition: A | Discharge status: Home / Self Care | Payer: Medicaid Other | Source: Ambulatory Visit | Attending: Family Medicine | Admitting: Family Medicine

## 2015-07-11 ENCOUNTER — Inpatient Hospital Stay (HOSPITAL_COMMUNITY): Payer: Medicaid Other | Admitting: Certified Registered Nurse Anesthetist

## 2015-07-11 ENCOUNTER — Encounter (HOSPITAL_COMMUNITY): Payer: Self-pay | Admitting: *Deleted

## 2015-07-11 ENCOUNTER — Inpatient Hospital Stay (HOSPITAL_COMMUNITY): Payer: Medicaid Other

## 2015-07-11 ENCOUNTER — Encounter (HOSPITAL_COMMUNITY): Payer: Self-pay | Admitting: Anesthesiology

## 2015-07-11 ENCOUNTER — Encounter (HOSPITAL_COMMUNITY)
Admission: AD | Disposition: A | Discharge status: Home / Self Care | Payer: Self-pay | Source: Ambulatory Visit | Attending: Family Medicine

## 2015-07-11 ENCOUNTER — Inpatient Hospital Stay (HOSPITAL_COMMUNITY)
Admission: AD | Admit: 2015-07-11 | Discharge: 2015-07-14 | DRG: 765 | Disposition: A | Discharge status: Home / Self Care | Payer: Medicaid Other | Source: Ambulatory Visit | Attending: Family Medicine | Admitting: Family Medicine

## 2015-07-11 ENCOUNTER — Other Ambulatory Visit: Payer: Self-pay | Admitting: Family Medicine

## 2015-07-11 ENCOUNTER — Encounter: Payer: Self-pay | Admitting: Family Medicine

## 2015-07-11 ENCOUNTER — Encounter: Payer: PRIVATE HEALTH INSURANCE | Admitting: Obstetrics and Gynecology

## 2015-07-11 VITALS — BP 158/78 | HR 65 | Temp 97.5°F | Wt 207.3 lb

## 2015-07-11 DIAGNOSIS — O09899 Supervision of other high risk pregnancies, unspecified trimester: Secondary | ICD-10-CM

## 2015-07-11 DIAGNOSIS — A562 Chlamydial infection of genitourinary tract, unspecified: Secondary | ICD-10-CM | POA: Diagnosis present

## 2015-07-11 DIAGNOSIS — O1002 Pre-existing essential hypertension complicating childbirth: Secondary | ICD-10-CM | POA: Diagnosis present

## 2015-07-11 DIAGNOSIS — Z452 Encounter for adjustment and management of vascular access device: Secondary | ICD-10-CM

## 2015-07-11 DIAGNOSIS — Z833 Family history of diabetes mellitus: Secondary | ICD-10-CM | POA: Diagnosis not present

## 2015-07-11 DIAGNOSIS — A749 Chlamydial infection, unspecified: Secondary | ICD-10-CM

## 2015-07-11 DIAGNOSIS — O289 Unspecified abnormal findings on antenatal screening of mother: Secondary | ICD-10-CM | POA: Diagnosis not present

## 2015-07-11 DIAGNOSIS — O0990 Supervision of high risk pregnancy, unspecified, unspecified trimester: Secondary | ICD-10-CM

## 2015-07-11 DIAGNOSIS — O24425 Gestational diabetes mellitus in childbirth, controlled by oral hypoglycemic drugs: Secondary | ICD-10-CM | POA: Diagnosis present

## 2015-07-11 DIAGNOSIS — O24419 Gestational diabetes mellitus in pregnancy, unspecified control: Secondary | ICD-10-CM

## 2015-07-11 DIAGNOSIS — Z6841 Body Mass Index (BMI) 40.0 and over, adult: Secondary | ICD-10-CM

## 2015-07-11 DIAGNOSIS — O368130 Decreased fetal movements, third trimester, not applicable or unspecified: Secondary | ICD-10-CM | POA: Diagnosis present

## 2015-07-11 DIAGNOSIS — Z8249 Family history of ischemic heart disease and other diseases of the circulatory system: Secondary | ICD-10-CM

## 2015-07-11 DIAGNOSIS — O288 Other abnormal findings on antenatal screening of mother: Secondary | ICD-10-CM

## 2015-07-11 DIAGNOSIS — O98813 Other maternal infectious and parasitic diseases complicating pregnancy, third trimester: Secondary | ICD-10-CM

## 2015-07-11 DIAGNOSIS — O24429 Gestational diabetes mellitus in childbirth, unspecified control: Secondary | ICD-10-CM

## 2015-07-11 DIAGNOSIS — O09529 Supervision of elderly multigravida, unspecified trimester: Secondary | ICD-10-CM

## 2015-07-11 DIAGNOSIS — Z98891 History of uterine scar from previous surgery: Secondary | ICD-10-CM

## 2015-07-11 DIAGNOSIS — O34219 Maternal care for unspecified type scar from previous cesarean delivery: Secondary | ICD-10-CM

## 2015-07-11 DIAGNOSIS — Z283 Underimmunization status: Secondary | ICD-10-CM

## 2015-07-11 DIAGNOSIS — O0930 Supervision of pregnancy with insufficient antenatal care, unspecified trimester: Secondary | ICD-10-CM

## 2015-07-11 DIAGNOSIS — O9989 Other specified diseases and conditions complicating pregnancy, childbirth and the puerperium: Secondary | ICD-10-CM

## 2015-07-11 DIAGNOSIS — Z3A37 37 weeks gestation of pregnancy: Secondary | ICD-10-CM

## 2015-07-11 DIAGNOSIS — O34211 Maternal care for low transverse scar from previous cesarean delivery: Secondary | ICD-10-CM | POA: Diagnosis present

## 2015-07-11 DIAGNOSIS — O09523 Supervision of elderly multigravida, third trimester: Secondary | ICD-10-CM

## 2015-07-11 DIAGNOSIS — O99214 Obesity complicating childbirth: Secondary | ICD-10-CM | POA: Diagnosis present

## 2015-07-11 DIAGNOSIS — O0993 Supervision of high risk pregnancy, unspecified, third trimester: Secondary | ICD-10-CM

## 2015-07-11 DIAGNOSIS — O98819 Other maternal infectious and parasitic diseases complicating pregnancy, unspecified trimester: Secondary | ICD-10-CM

## 2015-07-11 DIAGNOSIS — A5602 Chlamydial vulvovaginitis: Secondary | ICD-10-CM

## 2015-07-11 DIAGNOSIS — O98313 Other infections with a predominantly sexual mode of transmission complicating pregnancy, third trimester: Secondary | ICD-10-CM | POA: Diagnosis not present

## 2015-07-11 DIAGNOSIS — IMO0002 Reserved for concepts with insufficient information to code with codable children: Secondary | ICD-10-CM | POA: Diagnosis present

## 2015-07-11 DIAGNOSIS — O9832 Other infections with a predominantly sexual mode of transmission complicating childbirth: Secondary | ICD-10-CM | POA: Diagnosis present

## 2015-07-11 DIAGNOSIS — O368190 Decreased fetal movements, unspecified trimester, not applicable or unspecified: Secondary | ICD-10-CM

## 2015-07-11 LAB — POCT URINALYSIS DIP (DEVICE)
BILIRUBIN URINE: NEGATIVE
Glucose, UA: NEGATIVE mg/dL
HGB URINE DIPSTICK: NEGATIVE
Ketones, ur: NEGATIVE mg/dL
LEUKOCYTES UA: NEGATIVE
NITRITE: NEGATIVE
PH: 6 (ref 5.0–8.0)
Protein, ur: 30 mg/dL — AB
Urobilinogen, UA: 0.2 mg/dL (ref 0.0–1.0)

## 2015-07-11 LAB — CBC
HEMATOCRIT: 35.4 % — AB (ref 36.0–46.0)
Hemoglobin: 12 g/dL (ref 12.0–15.0)
MCH: 27.1 pg (ref 26.0–34.0)
MCHC: 33.9 g/dL (ref 30.0–36.0)
MCV: 79.9 fL (ref 78.0–100.0)
Platelets: 226 10*3/uL (ref 150–400)
RBC: 4.43 MIL/uL (ref 3.87–5.11)
RDW: 16.6 % — AB (ref 11.5–15.5)
WBC: 10.9 10*3/uL — AB (ref 4.0–10.5)

## 2015-07-11 LAB — COMPREHENSIVE METABOLIC PANEL
ALT: 11 U/L — AB (ref 14–54)
AST: 16 U/L (ref 15–41)
Albumin: 2.8 g/dL — ABNORMAL LOW (ref 3.5–5.0)
Alkaline Phosphatase: 197 U/L — ABNORMAL HIGH (ref 38–126)
Anion gap: 6 (ref 5–15)
BILIRUBIN TOTAL: 0.5 mg/dL (ref 0.3–1.2)
BUN: 15 mg/dL (ref 6–20)
CO2: 18 mmol/L — ABNORMAL LOW (ref 22–32)
CREATININE: 0.88 mg/dL (ref 0.44–1.00)
Calcium: 8.5 mg/dL — ABNORMAL LOW (ref 8.9–10.3)
Chloride: 111 mmol/L (ref 101–111)
GFR calc Af Amer: >60 mL/min (ref >60)
Glucose, Bld: 84 mg/dL (ref 65–99)
POTASSIUM: 4.5 mmol/L (ref 3.5–5.1)
Sodium: 135 mmol/L (ref 135–145)
TOTAL PROTEIN: 8 g/dL (ref 6.5–8.1)

## 2015-07-11 LAB — TYPE AND SCREEN
ABO/RH(D): B POS
Antibody Screen: NEGATIVE

## 2015-07-11 LAB — PROTEIN / CREATININE RATIO, URINE
CREATININE, URINE: 48 mg/dL
Protein Creatinine Ratio: 0.77 mg/mg{Cre} — ABNORMAL HIGH (ref 0.00–0.15)
Total Protein, Urine: 37 mg/dL

## 2015-07-11 SURGERY — Surgical Case
Anesthesia: Spinal

## 2015-07-11 MED ORDER — OXYTOCIN 10 UNIT/ML IJ SOLN
40.0000 [IU] | INTRAVENOUS | Status: DC | PRN
  Administered 2015-07-11: 40 [IU] via INTRAVENOUS

## 2015-07-11 MED ORDER — CITRIC ACID-SODIUM CITRATE 334-500 MG/5ML PO SOLN: 30.0000 mL | ORAL | Status: DC

## 2015-07-11 MED ORDER — MEPERIDINE HCL 25 MG/ML IJ SOLN: 6.2500 mg | INTRAMUSCULAR | Status: DC | PRN

## 2015-07-11 MED ORDER — SIMETHICONE 80 MG PO CHEW
80.0000 mg | CHEWABLE_TABLET | Freq: Three times a day (TID) | ORAL | Status: DC
  Administered 2015-07-11 – 2015-07-14 (×11): 80 mg via ORAL
  Filled 2015-07-11 (×9): qty 1

## 2015-07-11 MED ORDER — KETOROLAC TROMETHAMINE 30 MG/ML IJ SOLN: 30.0000 mg | Freq: Four times a day (QID) | INTRAMUSCULAR | Status: AC | PRN

## 2015-07-11 MED ORDER — OXYCODONE HCL 5 MG PO TABS
5.0000 mg | ORAL_TABLET | ORAL | Status: DC | PRN
  Administered 2015-07-12 – 2015-07-14 (×4): 5 mg via ORAL
  Filled 2015-07-11 (×4): qty 1

## 2015-07-11 MED ORDER — LACTATED RINGERS IV SOLN: 125.0000 mL/h | INTRAVENOUS | Status: DC

## 2015-07-11 MED ORDER — NALBUPHINE HCL 10 MG/ML IJ SOLN: 5.0000 mg | Freq: Once | INTRAMUSCULAR | Status: DC | PRN

## 2015-07-11 MED ORDER — PRENATAL MULTIVITAMIN CH
1.0000 | ORAL_TABLET | Freq: Every day | ORAL | Status: DC
  Administered 2015-07-12 – 2015-07-14 (×3): 1 via ORAL
  Filled 2015-07-11 (×3): qty 1

## 2015-07-11 MED ORDER — CEFAZOLIN SODIUM-DEXTROSE 2-3 GM-% IV SOLR: 2.0000 g | INTRAVENOUS | Status: DC

## 2015-07-11 MED ORDER — OXYTOCIN 10 UNIT/ML IJ SOLN: 2.5000 [IU]/h | INTRAMUSCULAR | Status: AC

## 2015-07-11 MED ORDER — MENTHOL 3 MG MT LOZG: 1.0000 | LOZENGE | OROMUCOSAL | Status: DC | PRN

## 2015-07-11 MED ORDER — SIMETHICONE 80 MG PO CHEW
80.0000 mg | CHEWABLE_TABLET | ORAL | Status: DC
  Administered 2015-07-12: 80 mg via ORAL
  Filled 2015-07-11 (×3): qty 1

## 2015-07-11 MED ORDER — SODIUM CHLORIDE 0.9% FLUSH: 3.0000 mL | INTRAVENOUS | Status: DC | PRN

## 2015-07-11 MED ORDER — LACTATED RINGERS IV SOLN
INTRAVENOUS | Status: DC
  Administered 2015-07-12: 05:00:00 via INTRAVENOUS

## 2015-07-11 MED ORDER — PHENYLEPHRINE 8 MG IN D5W 100 ML (0.08MG/ML) PREMIX OPTIME
INJECTION | INTRAVENOUS | Status: AC
  Filled 2015-07-11: qty 100

## 2015-07-11 MED ORDER — FENTANYL CITRATE (PF) 100 MCG/2ML IJ SOLN
INTRAMUSCULAR | Status: DC | PRN
  Administered 2015-07-11: 10 ug via INTRATHECAL

## 2015-07-11 MED ORDER — SODIUM CHLORIDE 0.9 % IV SOLN: Freq: Once | INTRAVENOUS | Status: DC

## 2015-07-11 MED ORDER — ACETAMINOPHEN 325 MG PO TABS
650.0000 mg | ORAL_TABLET | ORAL | Status: DC | PRN
  Administered 2015-07-11 – 2015-07-14 (×2): 650 mg via ORAL
  Filled 2015-07-11 (×2): qty 2

## 2015-07-11 MED ORDER — AZITHROMYCIN 500 MG PO TABS: 1000.0000 mg | ORAL_TABLET | Freq: Once | ORAL | Status: DC

## 2015-07-11 MED ORDER — NALBUPHINE HCL 10 MG/ML IJ SOLN: 5.0000 mg | INTRAMUSCULAR | Status: DC | PRN

## 2015-07-11 MED ORDER — OXYTOCIN 10 UNIT/ML IJ SOLN: 2.5000 [IU]/h | INTRAVENOUS | Status: DC

## 2015-07-11 MED ORDER — ONDANSETRON HCL 4 MG/2ML IJ SOLN
4.0000 mg | Freq: Four times a day (QID) | INTRAMUSCULAR | Status: DC | PRN
  Administered 2015-07-11: 4 mg via INTRAVENOUS

## 2015-07-11 MED ORDER — DIPHENHYDRAMINE HCL 25 MG PO CAPS: 25.0000 mg | ORAL_CAPSULE | ORAL | Status: DC | PRN

## 2015-07-11 MED ORDER — LANOLIN HYDROUS EX OINT: 1.0000 "application " | TOPICAL_OINTMENT | CUTANEOUS | Status: DC | PRN

## 2015-07-11 MED ORDER — IBUPROFEN 600 MG PO TABS
600.0000 mg | ORAL_TABLET | Freq: Four times a day (QID) | ORAL | Status: DC
Start: 2015-07-12 — End: 2015-07-14
  Administered 2015-07-11 – 2015-07-14 (×12): 600 mg via ORAL
  Filled 2015-07-11 (×12): qty 1

## 2015-07-11 MED ORDER — LACTATED RINGERS IV SOLN: INTRAVENOUS | Status: DC

## 2015-07-11 MED ORDER — OXYTOCIN 10 UNIT/ML IJ SOLN
INTRAMUSCULAR | Status: AC
  Filled 2015-07-11: qty 4

## 2015-07-11 MED ORDER — MORPHINE SULFATE (PF) 0.5 MG/ML IJ SOLN
INTRAMUSCULAR | Status: DC | PRN
  Administered 2015-07-11: .2 mg via INTRATHECAL

## 2015-07-11 MED ORDER — CEFAZOLIN SODIUM-DEXTROSE 2-3 GM-% IV SOLR
2.0000 g | INTRAVENOUS | Status: AC
  Administered 2015-07-11: 2 g via INTRAVENOUS
  Filled 2015-07-11: qty 50

## 2015-07-11 MED ORDER — SENNOSIDES-DOCUSATE SODIUM 8.6-50 MG PO TABS
2.0000 | ORAL_TABLET | ORAL | Status: DC
  Administered 2015-07-11 – 2015-07-13 (×3): 2 via ORAL
  Filled 2015-07-11 (×3): qty 2

## 2015-07-11 MED ORDER — PROMETHAZINE HCL 25 MG/ML IJ SOLN: 12.5000 mg | Freq: Once | INTRAMUSCULAR | Status: DC

## 2015-07-11 MED ORDER — GLYBURIDE 2.5 MG PO TABS: 2.5000 mg | ORAL_TABLET | Freq: Two times a day (BID) | ORAL | Status: DC

## 2015-07-11 MED ORDER — OXYTOCIN BOLUS FROM INFUSION: 500.0000 mL | INTRAVENOUS | Status: DC

## 2015-07-11 MED ORDER — PHENYLEPHRINE 8 MG IN D5W 100 ML (0.08MG/ML) PREMIX OPTIME
INJECTION | INTRAVENOUS | Status: DC | PRN
  Administered 2015-07-11: 60 ug/min via INTRAVENOUS

## 2015-07-11 MED ORDER — BUPIVACAINE IN DEXTROSE 0.75-8.25 % IT SOLN
INTRATHECAL | Status: DC | PRN
  Administered 2015-07-11: 1.6 mL via INTRATHECAL

## 2015-07-11 MED ORDER — WITCH HAZEL-GLYCERIN EX PADS: 1.0000 "application " | MEDICATED_PAD | CUTANEOUS | Status: DC | PRN

## 2015-07-11 MED ORDER — CITRIC ACID-SODIUM CITRATE 334-500 MG/5ML PO SOLN
30.0000 mL | ORAL | Status: DC | PRN
  Filled 2015-07-11 (×2): qty 15

## 2015-07-11 MED ORDER — NALOXONE HCL 0.4 MG/ML IJ SOLN
0.4000 mg | INTRAMUSCULAR | Status: DC | PRN
Start: 2015-07-11 — End: 2015-07-14

## 2015-07-11 MED ORDER — SIMETHICONE 80 MG PO CHEW: 80.0000 mg | CHEWABLE_TABLET | ORAL | Status: DC | PRN

## 2015-07-11 MED ORDER — LACTATED RINGERS IV SOLN
INTRAVENOUS | Status: DC
  Administered 2015-07-11 (×3): via INTRAVENOUS

## 2015-07-11 MED ORDER — DIBUCAINE 1 % RE OINT: 1.0000 "application " | TOPICAL_OINTMENT | RECTAL | Status: DC | PRN

## 2015-07-11 MED ORDER — NALOXONE HCL 2 MG/2ML IJ SOSY: 1.0000 ug/kg/h | PREFILLED_SYRINGE | INTRAVENOUS | Status: DC | PRN

## 2015-07-11 MED ORDER — LACTATED RINGERS IV BOLUS (SEPSIS)
500.0000 mL | Freq: Once | INTRAVENOUS | Status: DC
Start: 2015-07-11 — End: 2015-07-14

## 2015-07-11 MED ORDER — LACTATED RINGERS IV SOLN: 500.0000 mL | INTRAVENOUS | Status: DC | PRN

## 2015-07-11 MED ORDER — LIDOCAINE HCL (PF) 1 % IJ SOLN: 30.0000 mL | INTRAMUSCULAR | Status: DC | PRN

## 2015-07-11 MED ORDER — MORPHINE SULFATE (PF) 0.5 MG/ML IJ SOLN
INTRAMUSCULAR | Status: AC
  Filled 2015-07-11: qty 10

## 2015-07-11 MED ORDER — DIPHENHYDRAMINE HCL 50 MG/ML IJ SOLN
12.5000 mg | INTRAMUSCULAR | Status: DC | PRN
Start: 2015-07-11 — End: 2015-07-14

## 2015-07-11 MED ORDER — MORPHINE SULFATE (PF) 4 MG/ML IV SOLN: 2.0000 mg | Freq: Once | INTRAVENOUS | Status: DC

## 2015-07-11 MED ORDER — FENTANYL CITRATE (PF) 100 MCG/2ML IJ SOLN
INTRAMUSCULAR | Status: AC
  Filled 2015-07-11: qty 2

## 2015-07-11 MED ORDER — CITRIC ACID-SODIUM CITRATE 334-500 MG/5ML PO SOLN
30.0000 mL | Freq: Once | ORAL | Status: AC
  Administered 2015-07-11: 30 mL via ORAL
  Filled 2015-07-11: qty 30

## 2015-07-11 MED ORDER — EPHEDRINE SULFATE 50 MG/ML IJ SOLN
INTRAMUSCULAR | Status: DC | PRN
  Administered 2015-07-11: 10 mg via INTRAVENOUS

## 2015-07-11 MED ORDER — TETANUS-DIPHTH-ACELL PERTUSSIS 5-2.5-18.5 LF-MCG/0.5 IM SUSP: 0.5000 mL | Freq: Once | INTRAMUSCULAR | Status: DC

## 2015-07-11 MED ORDER — ONDANSETRON HCL 4 MG/2ML IJ SOLN
INTRAMUSCULAR | Status: AC
  Filled 2015-07-11: qty 2

## 2015-07-11 MED ORDER — ACETAMINOPHEN 325 MG PO TABS: 650.0000 mg | ORAL_TABLET | ORAL | Status: DC | PRN

## 2015-07-11 MED ORDER — PROMETHAZINE HCL 25 MG/ML IJ SOLN: 6.2500 mg | INTRAMUSCULAR | Status: DC | PRN

## 2015-07-11 MED ORDER — FENTANYL CITRATE (PF) 100 MCG/2ML IJ SOLN: 25.0000 ug | INTRAMUSCULAR | Status: DC | PRN

## 2015-07-11 MED ORDER — DIPHENHYDRAMINE HCL 25 MG PO CAPS: 25.0000 mg | ORAL_CAPSULE | Freq: Four times a day (QID) | ORAL | Status: DC | PRN

## 2015-07-11 MED ORDER — ONDANSETRON HCL 4 MG/2ML IJ SOLN: 4.0000 mg | Freq: Three times a day (TID) | INTRAMUSCULAR | Status: DC | PRN

## 2015-07-11 MED ORDER — AZITHROMYCIN 250 MG PO TABS
1000.0000 mg | ORAL_TABLET | Freq: Once | ORAL | Status: AC
  Administered 2015-07-11: 1000 mg via ORAL
  Filled 2015-07-11: qty 4

## 2015-07-11 MED ORDER — SCOPOLAMINE 1 MG/3DAYS TD PT72: 1.0000 | MEDICATED_PATCH | Freq: Once | TRANSDERMAL | Status: DC

## 2015-07-11 MED ORDER — OXYCODONE HCL 5 MG PO TABS
10.0000 mg | ORAL_TABLET | ORAL | Status: DC | PRN
  Administered 2015-07-13: 10 mg via ORAL
  Filled 2015-07-11 (×2): qty 2

## 2015-07-11 SURGICAL SUPPLY — 38 items
APL SKNCLS STERI-STRIP NONHPOA (GAUZE/BANDAGES/DRESSINGS) ×1
BENZOIN TINCTURE PRP APPL 2/3 (GAUZE/BANDAGES/DRESSINGS) ×2 IMPLANT
CATH ROBINSON RED A/P 16FR (CATHETERS) IMPLANT
CLAMP CORD UMBIL (MISCELLANEOUS) IMPLANT
CLOSURE STERI STRIP 1/2 X4 (GAUZE/BANDAGES/DRESSINGS) ×1 IMPLANT
CLOTH BEACON ORANGE TIMEOUT ST (SAFETY) ×2 IMPLANT
DRSG OPSITE POSTOP 4X10 (GAUZE/BANDAGES/DRESSINGS) ×2 IMPLANT
DURAPREP 26ML APPLICATOR (WOUND CARE) ×2 IMPLANT
ELECT REM PT RETURN 9FT ADLT (ELECTROSURGICAL) ×2
ELECTRODE REM PT RTRN 9FT ADLT (ELECTROSURGICAL) ×1 IMPLANT
EXTRACTOR VACUUM M CUP 4 TUBE (SUCTIONS) IMPLANT
GLOVE BIOGEL PI IND STRL 7.0 (GLOVE) ×1 IMPLANT
GLOVE BIOGEL PI IND STRL 7.5 (GLOVE) ×2 IMPLANT
GLOVE BIOGEL PI INDICATOR 7.0 (GLOVE) ×1
GLOVE BIOGEL PI INDICATOR 7.5 (GLOVE) ×2
GLOVE ECLIPSE 7.5 STRL STRAW (GLOVE) ×2 IMPLANT
GOWN STRL REUS W/TWL LRG LVL3 (GOWN DISPOSABLE) ×6 IMPLANT
KIT ABG SYR 3ML LUER SLIP (SYRINGE) IMPLANT
NDL HYPO 25X5/8 SAFETYGLIDE (NEEDLE) IMPLANT
NEEDLE HYPO 25X5/8 SAFETYGLIDE (NEEDLE) IMPLANT
NS IRRIG 1000ML POUR BTL (IV SOLUTION) ×2 IMPLANT
PACK C SECTION WH (CUSTOM PROCEDURE TRAY) ×2 IMPLANT
PAD ABD 7.5X8 STRL (GAUZE/BANDAGES/DRESSINGS) ×1 IMPLANT
PAD OB MATERNITY 4.3X12.25 (PERSONAL CARE ITEMS) ×2 IMPLANT
PENCIL SMOKE EVAC W/HOLSTER (ELECTROSURGICAL) ×2 IMPLANT
RTRCTR C-SECT PINK 25CM LRG (MISCELLANEOUS) ×2 IMPLANT
SPONGE GAUZE 4X4 12PLY STER LF (GAUZE/BANDAGES/DRESSINGS) ×1 IMPLANT
STRIP CLOSURE SKIN 1/2X4 (GAUZE/BANDAGES/DRESSINGS) ×2 IMPLANT
SUT MNCRL 0 VIOLET CTX 36 (SUTURE) IMPLANT
SUT MONOCRYL 0 CTX 36 (SUTURE)
SUT PLAIN 2 0 XLH (SUTURE) ×1 IMPLANT
SUT VIC AB 0 CTX 36 (SUTURE) ×6
SUT VIC AB 0 CTX36XBRD ANBCTRL (SUTURE) ×3 IMPLANT
SUT VIC AB 2-0 CT1 27 (SUTURE) ×2
SUT VIC AB 2-0 CT1 TAPERPNT 27 (SUTURE) ×1 IMPLANT
SUT VIC AB 4-0 KS 27 (SUTURE) ×2 IMPLANT
TOWEL OR 17X24 6PK STRL BLUE (TOWEL DISPOSABLE) ×2 IMPLANT
TRAY FOLEY CATH SILVER 14FR (SET/KITS/TRAYS/PACK) ×2 IMPLANT

## 2015-07-11 NOTE — Patient Instructions (Signed)
Gestational Diabetes Mellitus Gestational diabetes mellitus, often simply referred to as gestational diabetes, is a type of diabetes that some women develop during pregnancy. In gestational diabetes, the pancreas does not make enough insulin (a hormone), the cells are less responsive to the insulin that is made (insulin resistance), or both.Normally, insulin moves sugars from food into the tissue cells. The tissue cells use the sugars for energy. The lack of insulin or the lack of normal response to insulin causes excess sugars to build up in the blood instead of going into the tissue cells. As a result, high blood sugar (hyperglycemia) develops. The effect of high sugar (glucose) levels can cause many problems.  RISK FACTORS You have an increased chance of developing gestational diabetes if you have a family history of diabetes and also have one or more of the following risk factors:  A body mass index over 30 (obesity).  A previous pregnancy with gestational diabetes.  An older age at the time of pregnancy. If blood glucose levels are kept in the normal range during pregnancy, women can have a healthy pregnancy. If your blood glucose levels are not well controlled, there may be risks to you, your unborn baby (fetus), your labor and delivery, or your newborn baby.  SYMPTOMS  If symptoms are experienced, they are much like symptoms you would normally expect during pregnancy. The symptoms of gestational diabetes include:   Increased thirst (polydipsia).  Increased urination (polyuria).  Increased urination during the night (nocturia).  Weight loss. This weight loss may be rapid.  Frequent, recurring infections.  Tiredness (fatigue).  Weakness.  Vision changes, such as blurred vision.  Fruity smell to your breath.  Abdominal pain. DIAGNOSIS Diabetes is diagnosed when blood glucose levels are increased. Your blood glucose level may be checked by one or more of the following blood  tests:  A fasting blood glucose test. You will not be allowed to eat for at least 8 hours before a blood sample is taken.  A random blood glucose test. Your blood glucose is checked at any time of the day regardless of when you ate.  An oral glucose tolerance test (OGTT). Your blood glucose is measured after you have not eaten (fasted) for 1-3 hours and then after you drink a glucose-containing beverage. Since the hormones that cause insulin resistance are highest at about 24-28 weeks of a pregnancy, an OGTT is usually performed during that time. If you have risk factors, you may be screened for undiagnosed type 2 diabetes at your first prenatal visit. TREATMENT  Gestational diabetes should be managed first with diet and exercise. Medicines may be added only if they are needed.  You will need to take diabetes medicine or insulin daily to keep blood glucose levels in the desired range.  You will need to match insulin dosing with exercise and healthy food choices. If you have gestational diabetes, your treatment goal is to maintain the following blood glucose levels:  Before meals (preprandial): at or below 95 mg/dL.  After meals (postprandial):  One hour after a meal: at or below 140 mg/dL.  Two hours after a meal: at or below 120 mg/dL. If you have pre-existing type 1 or type 2 diabetes, your treatment goal is to maintain the following blood glucose levels:  Before meals, at bedtime, and overnight: 60-99 mg/dL.  After meals: peak of 100-129 mg/dL. HOME CARE INSTRUCTIONS   Have your hemoglobin A1c level checked twice a year.  Perform daily blood glucose monitoring as directed by   your health care provider. It is common to perform frequent blood glucose monitoring.  Monitor urine ketones when you are ill and as directed by your health care provider.  Take your diabetes medicine and insulin as directed by your health care provider to maintain your blood glucose level in the desired  range.  Never run out of diabetes medicine or insulin. It is needed every day.  Adjust insulin based on your intake of carbohydrates. Carbohydrates can raise blood glucose levels but need to be included in your diet. Carbohydrates provide vitamins, minerals, and fiber which are an essential part of a healthy diet. Carbohydrates are found in fruits, vegetables, whole grains, dairy products, legumes, and foods containing added sugars.  Eat healthy foods. Alternate 3 meals with 3 snacks.  Maintain a healthy weight gain. The usual total expected weight gain varies according to your prepregnancy body mass index (BMI).  Carry a medical alert card or wear your medical alert jewelry.  Carry a 15-gram carbohydrate snack with you at all times to treat low blood glucose (hypoglycemia). Some examples of 15-gram carbohydrate snacks include:  Glucose tablets, 3 or 4.  Glucose gel, 15-gram tube.  Raisins, 2 tablespoons (24 g).  Jelly beans, 6.  Animal crackers, 8.  Fruit juice, regular soda, or low-fat milk, 4 ounces (120 mL).  Gummy treats, 9.  Recognize hypoglycemia. Hypoglycemia during pregnancy occurs with blood glucose levels of 60 mg/dL and below. The risk for hypoglycemia increases when fasting or skipping meals, during or after intense exercise, and during sleep. Hypoglycemia symptoms can include:  Tremors or shakes.  Decreased ability to concentrate.  Sweating.  Increased heart rate.  Headache.  Dry mouth.  Hunger.  Irritability.  Anxiety.  Restless sleep.  Altered speech or coordination.  Confusion.  Treat hypoglycemia promptly. If you are alert and able to safely swallow, follow the 15:15 rule:  Take 15-20 grams of rapid-acting glucose or carbohydrate. Rapid-acting options include glucose gel, glucose tablets, or 4 ounces (120 mL) of fruit juice, regular soda, or low-fat milk.  Check your blood glucose level 15 minutes after taking the glucose.  Take 15-20  grams more of glucose if the repeat blood glucose level is still 70 mg/dL or below.  Eat a meal or snack within 1 hour once blood glucose levels return to normal.  Be alert to polyuria (excess urination) and polydipsia (excess thirst) which are early signs of hyperglycemia. An early awareness of hyperglycemia allows for prompt treatment. Treat hyperglycemia as directed by your health care provider.  Engage in at least 30 minutes of physical activity a day or as directed by your health care provider. Ten minutes of physical activity timed 30 minutes after each meal is encouraged to control postprandial blood glucose levels.  Adjust your insulin dosing and food intake as needed if you start a new exercise or sport.  Follow your sick-day plan at any time you are unable to eat or drink as usual.  Avoid tobacco and alcohol use.  Keep all follow-up visits as directed by your health care provider.  Follow the advice of your health care provider regarding your prenatal and post-delivery (postpartum) appointments, meal planning, exercise, medicines, vitamins, blood tests, other medical tests, and physical activities.  Perform daily skin and foot care. Examine your skin and feet daily for cuts, bruises, redness, nail problems, bleeding, blisters, or sores.  Brush your teeth and gums at least twice a day and floss at least once a day. Follow up with your dentist   regularly.  Schedule an eye exam during the first trimester of your pregnancy or as directed by your health care provider.  Share your diabetes management plan with your workplace or school.  Stay up-to-date with immunizations.  Learn to manage stress.  Obtain ongoing diabetes education and support as needed.  Learn about and consider breastfeeding your baby.  You should have your blood sugar level checked 6-12 weeks after delivery. This is done with an oral glucose tolerance test (OGTT). SEEK MEDICAL CARE IF:   You are unable to  eat food or drink fluids for more than 6 hours.  You have nausea and vomiting for more than 6 hours.  You have a blood glucose level of 200 mg/dL and you have ketones in your urine.  There is a change in mental status.  You develop vision problems.  You have a persistent headache.  You have upper abdominal pain or discomfort.  You develop an additional serious illness.  You have diarrhea for more than 6 hours.  You have been sick or have had a fever for a couple of days and are not getting better. SEEK IMMEDIATE MEDICAL CARE IF:   You have difficulty breathing.  You no longer feel the baby moving.  You are bleeding or have discharge from your vagina.  You start having premature contractions or labor. MAKE SURE YOU:  Understand these instructions.  Will watch your condition.  Will get help right away if you are not doing well or get worse.   This information is not intended to replace advice given to you by your health care provider. Make sure you discuss any questions you have with your health care provider.   Document Released: 07/16/2000 Document Revised: 04/30/2014 Document Reviewed: 11/06/2011 [employer] Interactive Patient Education 2016 [employer]  Breastfeeding Deciding to breastfeed is one of the best choices you can make for you and your baby. A change in hormones during pregnancy causes your breast tissue to grow and increases the number and size of your milk ducts. These hormones also allow proteins, sugars, and fats from your blood supply to make breast milk in your milk-producing glands. Hormones prevent breast milk from being released before your baby is born as well as prompt milk flow after birth. Once breastfeeding has begun, thoughts of your baby, as well as his or her sucking or crying, can stimulate the release of milk from your milk-producing glands.  BENEFITS OF BREASTFEEDING For Your Baby  Your first milk (colostrum) helps your baby's digestive  system function better.  There are antibodies in your milk that help your baby fight off infections.  Your baby has a lower incidence of asthma, allergies, and sudden infant death syndrome.  The nutrients in breast milk are better for your baby than infant formulas and are designed uniquely for your baby's needs.  Breast milk improves your baby's brain development.  Your baby is less likely to develop other conditions, such as childhood obesity, asthma, or type 2 diabetes mellitus. For You  Breastfeeding helps to create a very special bond between you and your baby.  Breastfeeding is convenient. Breast milk is always available at the correct temperature and costs nothing.  Breastfeeding helps to burn calories and helps you lose the weight gained during pregnancy.  Breastfeeding makes your uterus contract to its prepregnancy size faster and slows bleeding (lochia) after you give birth.   Breastfeeding helps to lower your risk of developing type 2 diabetes mellitus, osteoporosis, and breast or ovarian cancer   later in life. SIGNS THAT YOUR BABY IS HUNGRY Early Signs of Hunger  Increased alertness or activity.  Stretching.  Movement of the head from side to side.  Movement of the head and opening of the mouth when the corner of the mouth or cheek is stroked (rooting).  Increased sucking sounds, smacking lips, cooing, sighing, or squeaking.  Hand-to-mouth movements.  Increased sucking of fingers or hands. Late Signs of Hunger  Fussing.  Intermittent crying. Extreme Signs of Hunger Signs of extreme hunger will require calming and consoling before your baby will be able to breastfeed successfully. Do not wait for the following signs of extreme hunger to occur before you initiate breastfeeding:  Restlessness.  A loud, strong cry.  Screaming. BREASTFEEDING BASICS Breastfeeding Initiation  Find a comfortable place to sit or lie down, with your neck and back well  supported.  Place a pillow or rolled up blanket under your baby to bring him or her to the level of your breast (if you are seated). Nursing pillows are specially designed to help support your arms and your baby while you breastfeed.  Make sure that your baby's abdomen is facing your abdomen.  Gently massage your breast. With your fingertips, massage from your chest wall toward your nipple in a circular motion. This encourages milk flow. You may need to continue this action during the feeding if your milk flows slowly.  Support your breast with 4 fingers underneath and your thumb above your nipple. Make sure your fingers are well away from your nipple and your baby's mouth.  Stroke your baby's lips gently with your finger or nipple.  When your baby's mouth is open wide enough, quickly bring your baby to your breast, placing your entire nipple and as much of the colored area around your nipple (areola) as possible into your baby's mouth.  More areola should be visible above your baby's upper lip than below the lower lip.  Your baby's tongue should be between his or her lower gum and your breast.  Ensure that your baby's mouth is correctly positioned around your nipple (latched). Your baby's lips should create a seal on your breast and be turned out (everted).  It is common for your baby to suck about 2-3 minutes in order to start the flow of breast milk. Latching Teaching your baby how to latch on to your breast properly is very important. An improper latch can cause nipple pain and decreased milk supply for you and poor weight gain in your baby. Also, if your baby is not latched onto your nipple properly, he or she may swallow some air during feeding. This can make your baby fussy. Burping your baby when you switch breasts during the feeding can help to get rid of the air. However, teaching your baby to latch on properly is still the best way to prevent fussiness from swallowing air while  breastfeeding. Signs that your baby has successfully latched on to your nipple:  Silent tugging or silent sucking, without causing you pain.  Swallowing heard between every 3-4 sucks.  Muscle movement above and in front of his or her ears while sucking. Signs that your baby has not successfully latched on to nipple:  Sucking sounds or smacking sounds from your baby while breastfeeding.  Nipple pain. If you think your baby has not latched on correctly, slip your finger into the corner of your baby's mouth to break the suction and place it between your baby's gums. Attempt breastfeeding initiation again. Signs   of Successful Breastfeeding Signs from your baby:  A gradual decrease in the number of sucks or complete cessation of sucking.  Falling asleep.  Relaxation of his or her body.  Retention of a small amount of milk in his or her mouth.  Letting go of your breast by himself or herself. Signs from you:  Breasts that have increased in firmness, weight, and size 1-3 hours after feeding.  Breasts that are softer immediately after breastfeeding.  Increased milk volume, as well as a change in milk consistency and color by the fifth day of breastfeeding.  Nipples that are not sore, cracked, or bleeding. Signs That Your Baby is Getting Enough Milk  Wetting at least 3 diapers in a 24-hour period. The urine should be clear and pale yellow by age 5 days.  At least 3 stools in a 24-hour period by age 5 days. The stool should be soft and yellow.  At least 3 stools in a 24-hour period by age 7 days. The stool should be seedy and yellow.  No loss of weight greater than 10% of birth weight during the first 3 days of age.  Average weight gain of 4-7 ounces (113-198 g) per week after age 4 days.  Consistent daily weight gain by age 5 days, without weight loss after the age of 2 weeks. After a feeding, your baby may spit up a small amount. This is common. BREASTFEEDING FREQUENCY AND  DURATION Frequent feeding will help you make more milk and can prevent sore nipples and breast engorgement. Breastfeed when you feel the need to reduce the fullness of your breasts or when your baby shows signs of hunger. This is called "breastfeeding on demand." Avoid introducing a pacifier to your baby while you are working to establish breastfeeding (the first 4-6 weeks after your baby is born). After this time you may choose to use a pacifier. Research has shown that pacifier use during the first year of a baby's life decreases the risk of sudden infant death syndrome (SIDS). Allow your baby to feed on each breast as long as he or she wants. Breastfeed until your baby is finished feeding. When your baby unlatches or falls asleep while feeding from the first breast, offer the second breast. Because newborns are often sleepy in the first few weeks of life, you may need to awaken your baby to get him or her to feed. Breastfeeding times will vary from baby to baby. However, the following rules can serve as a guide to help you ensure that your baby is properly fed:  Newborns (babies 4 weeks of age or younger) may breastfeed every 1-3 hours.  Newborns should not go longer than 3 hours during the day or 5 hours during the night without breastfeeding.  You should breastfeed your baby a minimum of 8 times in a 24-hour period until you begin to introduce solid foods to your baby at around 6 months of age. BREAST MILK PUMPING Pumping and storing breast milk allows you to ensure that your baby is exclusively fed your breast milk, even at times when you are unable to breastfeed. This is especially important if you are going back to work while you are still breastfeeding or when you are not able to be present during feedings. Your lactation consultant can give you guidelines on how long it is safe to store breast milk. A breast pump is a machine that allows you to pump milk from your breast into a sterile bottle.  The pumped   breast milk can then be stored in a refrigerator or freezer. Some breast pumps are operated by hand, while others use electricity. Ask your lactation consultant which type will work best for you. Breast pumps can be purchased, but some hospitals and breastfeeding support groups lease breast pumps on a monthly basis. A lactation consultant can teach you how to hand express breast milk, if you prefer not to use a pump. CARING FOR YOUR BREASTS WHILE YOU BREASTFEED Nipples can become dry, cracked, and sore while breastfeeding. The following recommendations can help keep your breasts moisturized and healthy:  Avoid using soap on your nipples.  Wear a supportive bra. Although not required, special nursing bras and tank tops are designed to allow access to your breasts for breastfeeding without taking off your entire bra or top. Avoid wearing underwire-style bras or extremely tight bras.  Air dry your nipples for 3-4minutes after each feeding.  Use only cotton bra pads to absorb leaked breast milk. Leaking of breast milk between feedings is normal.  Use lanolin on your nipples after breastfeeding. Lanolin helps to maintain your skin's normal moisture barrier. If you use pure lanolin, you do not need to wash it off before feeding your baby again. Pure lanolin is not toxic to your baby. You may also hand express a few drops of breast milk and gently massage that milk into your nipples and allow the milk to air dry. In the first few weeks after giving birth, some women experience extremely full breasts (engorgement). Engorgement can make your breasts feel heavy, warm, and tender to the touch. Engorgement peaks within 3-5 days after you give birth. The following recommendations can help ease engorgement:  Completely empty your breasts while breastfeeding or pumping. You may want to start by applying warm, moist heat (in the shower or with warm water-soaked hand towels) just before feeding or pumping.  This increases circulation and helps the milk flow. If your baby does not completely empty your breasts while breastfeeding, pump any extra milk after he or she is finished.  Wear a snug bra (nursing or regular) or tank top for 1-2 days to signal your body to slightly decrease milk production.  Apply ice packs to your breasts, unless this is too uncomfortable for you.  Make sure that your baby is latched on and positioned properly while breastfeeding. If engorgement persists after 48 hours of following these recommendations, contact your health care provider or a lactation consultant. OVERALL HEALTH CARE RECOMMENDATIONS WHILE BREASTFEEDING  Eat healthy foods. Alternate between meals and snacks, eating 3 of each per day. Because what you eat affects your breast milk, some of the foods may make your baby more irritable than usual. Avoid eating these foods if you are sure that they are negatively affecting your baby.  Drink milk, fruit juice, and water to satisfy your thirst (about 10 glasses a day).  Rest often, relax, and continue to take your prenatal vitamins to prevent fatigue, stress, and anemia.  Continue breast self-awareness checks.  Avoid chewing and smoking tobacco. Chemicals from cigarettes that pass into breast milk and exposure to secondhand smoke may harm your baby.  Avoid alcohol and drug use, including marijuana. Some medicines that may be harmful to your baby can pass through breast milk. It is important to ask your health care provider before taking any medicine, including all over-the-counter and prescription medicine as well as vitamin and herbal supplements. It is possible to become pregnant while breastfeeding. If birth control is desired, ask   your health care provider about options that will be safe for your baby. SEEK MEDICAL CARE IF:  You feel like you want to stop breastfeeding or have become frustrated with breastfeeding.  You have painful breasts or  nipples.  Your nipples are cracked or bleeding.  Your breasts are red, tender, or warm.  You have a swollen area on either breast.  You have a fever or chills.  You have nausea or vomiting.  You have drainage other than breast milk from your nipples.  Your breasts do not become full before feedings by the fifth day after you give birth.  You feel sad and depressed.  Your baby is too sleepy to eat well.  Your baby is having trouble sleeping.   Your baby is wetting less than 3 diapers in a 24-hour period.  Your baby has less than 3 stools in a 24-hour period.  Your baby's skin or the white part of his or her eyes becomes yellow.   Your baby is not gaining weight by 5 days of age. SEEK IMMEDIATE MEDICAL CARE IF:  Your baby is overly tired (lethargic) and does not want to wake up and feed.  Your baby develops an unexplained fever.   This information is not intended to replace advice given to you by your health care provider. Make sure you discuss any questions you have with your health care provider.   Document Released: 04/09/2005 Document Revised: 12/29/2014 Document Reviewed: 10/01/2012 [employer] Interactive Patient Education 2016 [employer]  

## 2015-07-11 NOTE — H&P (Signed)
LABOR AND DELIVERY ADMISSION HISTORY AND PHYSICAL NOTE  Brittany Holt is a 39 y.o. female [phone] with IUP at 102w1dby L/33 presenting for decreased fetal movmeent. complaind of this at prenatal visit today, starting this am. Sent for bpp, which was 4/10 (those results not currently available but were phoned in by mfm. Fluid normal per report). Here continues to cite decreased fetal movement. Mild intermittent contractions.  Late prenatal care, today review of glucose log shows elevated values, plan was to start bid glyburide.  She denies leakage of fluid or vaginal bleeding.  Prenatal History/Complications:  Past Medical History: Past Medical History  Diagnosis Date  . Hypertension   . Diabetes mellitus without complication ([facility]     Past Surgical History: Past Surgical History  Procedure Laterality Date  . Cesarean section      x 2    Obstetrical History: OB History    Gravida Para Term Preterm AB TAB SAB Ectopic Multiple Living   _0 0 2 0 2 0 0 2      Social History: Social History   Social History  . Marital Status: Married    Spouse Name: N/A  . Number of Children: N/A  . Years of Education: N/A   Social History Main Topics  . Smoking status: Never Smoker   . Smokeless tobacco: Never Used  . Alcohol Use: No  . Drug Use: No  . Sexual Activity: Yes   Other Topics Concern  . None   Social History Narrative    Family History: Family History  Problem Relation Age of Onset  . Diabetes Father   . Heart disease Father   . Hypertension Father   . Hypertension Mother     Allergies: No Known Allergies  Prescriptions prior to admission  Medication Sig Dispense Refill Last Dose  . Prenatal Vit-Fe Fumarate-FA (PRENATAL VITAMINS) 28-0.8 MG TABS Take 1 tablet by mouth daily.    07/10/2015 at Unknown time  . azithromycin (ZITHROMAX) 500 MG tablet Take 2 tablets (1,000 mg total) by mouth once. (Patient not taking: Reported on 07/11/2015) 2 tablet 1   .  glyBURIDE (DIABETA) 2.5 MG tablet Take 1 tablet (2.5 mg total) by mouth 2 (two) times daily with a meal. (Patient not taking: Reported on 07/11/2015) 60 tablet 3      Review of Systems   All systems reviewed and negative except as stated in HPI  Blood pressure 137/81, pulse 81, resp. rate 18, height 4' 11" (1.499 m), weight 207 lb (93.895 kg), last menstrual period 10/24/2014. General appearance: alert, cooperative and appears stated age Lungs: clear to auscultation bilaterally Heart: regular rate and rhythm Abdomen: soft, non-tender; bowel sounds normal Extremities: No calf swelling or tenderness Fetal monitoring: 130/minimal/-a/shallow intermittent late decels Uterine activity: q 5-7 min      Prenatal labs: ABO, Rh: B/Positive/-- (02/20 0000) Antibody: Negative (02/20 0000) Rubella: !Error!not immune RPR: Nonreactive (02/20 0000)  HBsAg: Negative (02/20 0000)  HIV: Non-reactive (02/20 0000)  GBS:   neg 1 hr Glucola: 189, 3-hour only first value of 135 is recorde Genetic screening:  declined Anatomy UKorea wnl  Prenatal Transfer Tool  Maternal Diabetes: Yes:  Diabetes Type:  Insulin/Medication controlled Genetic Screening: Declined Maternal Ultrasounds/Referrals: Normal Fetal Ultrasounds or other Referrals:  None Maternal Substance Abuse:  No Significant Maternal Medications:  None Significant Maternal Lab Results: Lab values include: Group B Strep negative  Results for orders placed or performed during the hospital encounter of 07/11/15 (from the past 24  hour(s))  CBC   Collection Time: 07/11/15  2:25 PM  Result Value Ref Range   WBC 10.9 (H) 4.0 - 10.5 K/uL   RBC 4.43 3.87 - 5.11 MIL/uL   Hemoglobin 12.0 12.0 - 15.0 g/dL   HCT 35.4 (L) 36.0 - 46.0 %   MCV 79.9 78.0 - 100.0 fL   MCH 27.1 26.0 - 34.0 pg   MCHC 33.9 30.0 - 36.0 g/dL   RDW 16.6 (H) 11.5 - 15.5 %   Platelets 226 150 - 400 K/uL  Comprehensive metabolic panel   Collection Time: 07/11/15  2:25 PM  Result  Value Ref Range   Sodium 135 135 - 145 mmol/L   Potassium 4.5 3.5 - 5.1 mmol/L   Chloride 111 101 - 111 mmol/L   CO2 18 (L) 22 - 32 mmol/L   Glucose, Bld 84 65 - 99 mg/dL   BUN 15 6 - 20 mg/dL   Creatinine, Ser 0.88 0.44 - 1.00 mg/dL   Calcium 8.5 (L) 8.9 - 10.3 mg/dL   Total Protein 8.0 6.5 - 8.1 g/dL   Albumin 2.8 (L) 3.5 - 5.0 g/dL   AST 16 15 - 41 U/L   ALT 11 (L) 14 - 54 U/L   Alkaline Phosphatase 197 (H) 38 - 126 U/L   Total Bilirubin 0.5 0.3 - 1.2 mg/dL   GFR calc non Af Amer >60 >60 mL/min   GFR calc Af Amer >60 >60 mL/min   Anion gap 6 5 - 15  Results for orders placed or performed in visit on 07/11/15 (from the past 24 hour(s))  POCT urinalysis dip (device)   Collection Time: 07/11/15  9:54 AM  Result Value Ref Range   Glucose, UA NEGATIVE NEGATIVE mg/dL   Bilirubin Urine NEGATIVE NEGATIVE   Ketones, ur NEGATIVE NEGATIVE mg/dL   Specific Gravity, Urine <=1.005 1.005 - 1.030   Hgb urine dipstick NEGATIVE NEGATIVE   pH 6.0 5.0 - 8.0   Protein, ur 30 (A) NEGATIVE mg/dL   Urobilinogen, UA 0.2 0.0 - 1.0 mg/dL   Nitrite NEGATIVE NEGATIVE   Leukocytes, UA NEGATIVE NEGATIVE    Patient Active Problem List   Diagnosis Date Noted  . Fetal distress 07/11/2015  . Chlamydia infection affecting pregnancy, antepartum 07/04/2015  . Supervision of high-risk pregnancy 06/24/2015  . Gestational diabetes mellitus (GDM) 06/24/2015  . AMA (advanced maternal age) multigravida 35+ 06/24/2015  . Rubella non-immune status, antepartum 06/24/2015  . Previous cesarean delivery affecting pregnancy, antepartum 06/24/2015    Assessment: Brittany Holt is a 40 y.o. [id number] at 70w1dhere for non-reassuring BPP. Initial plan for repeat bpp this PM, but given late decels on fetal heart tracing, case discussed again with MFM and given term gestation decision made to proceed with c/s (history c/s x2, plan for repeat @ 39 wks) The risks of cesarean section were discussed with the patient including  but were not limited to: bleeding which may require transfusion or reoperation; infection which may require antibiotics; injury to bowel, bladder, ureters or other surrounding organs; injury to the fetus; need for additional procedures including hysterectomy in the event of a life-threatening hemorrhage; placental abnormalities wth subsequent pregnancies, incisional problems, thromboembolic phenomenon and other postoperative/anesthesia complications.  Plan for ancef 2 g IV prior to case #Pain: spinal #FWB: Category 2, proceeding to c/s #ID:  gbs neg #MOF: breast #MOC:undecided #Circ:  No #GDM: f/u glucose for today, plan to check fasting values PP #Hx gHtn: initial bp here elvated, remainder are wnl. hellp  labs were ordered #RNI: mmr post-partum #Chlamydia: positive recently, not treated. 1 g azithromycin given today.  Brittany Holt 07/11/2015, 3:36 PM

## 2015-07-11 NOTE — MAU Note (Deleted)
Chief Complaint: decreased fetal movement.  Brittany Holt is a 40 y.o. female [phone] with IUP at 27w1dby LMP, complicated by AMA, chronic HTN, c/s x2, and GDM presenting with decreased fetal movement and BPP of 4/10 this morning. She reports decreased +FMs (Patient states that she has only felt the baby move once in the last 24 hours), No LOF, no VB, no blurry vision, headaches or peripheral edema, and RUQ pain. Admits to numbness tingling in right hand. Began PClosterat 36 weeks. Only taking PNV.  Dating: By LMP ---> C/S planned for 07/26/15  Obstetrical/Gynecological History: OB History    Gravida Para Term Preterm AB TAB SAB Ectopic Multiple Living   '5 2 2 '$ 0 2 0 2 0 0 2      Past Medical History: Past Medical History  Diagnosis Date  . Hypertension   . Diabetes mellitus without complication ([facility]     Past Surgical History: Past Surgical History  Procedure Laterality Date  . Cesarean section      x 2    Family History: Family History  Problem Relation Age of Onset  . Diabetes Father   . Heart disease Father   . Hypertension Father   . Hypertension Mother     Social History: Social History  Substance Use Topics  . Smoking status: Never Smoker   . Smokeless tobacco: Never Used  . Alcohol Use: No    Allergies: No Known Allergies  Prescriptions prior to admission  Medication Sig Dispense Refill Last Dose  . azithromycin (ZITHROMAX) 500 MG tablet Take 2 tablets (1,000 mg total) by mouth once. 2 tablet 1   . glyBURIDE (DIABETA) 2.5 MG tablet Take 1 tablet (2.5 mg total) by mouth 2 (two) times daily with a meal. 60 tablet 3   . Prenatal Vit-Fe Fumarate-FA (PRENATAL VITAMINS) 28-0.8 MG TABS Take by mouth.   Taking   ROS: See PE   Physical Exam   Blood pressure 137/68, pulse 54, height '4\' 11"'$  (1.499 m), weight 93.895 kg (207 lb), last menstrual period  10/24/2014.  General: General appearance - alert, well appearing, and in no distress, oriented to person, place, and time, overweight and acyanotic, in no respiratory distress Mental status - alert, oriented to person, place, and time, normal mood, behavior, speech, dress, motor activity, and thought processes. Denies headache Ears - bilateral TM's and external ear canals normal, not examined Eyes - denies blurry vision or seeing spots Mouth - poor dentition, gold teeth Chest - clear to auscultation, no wheezes, rales or rhonchi, symmetric air entry Heart - normal rate, regular rhythm, normal S1, S2, no murmurs, rubs, clicks or gallops Abdomen - soft, nontender, nondistended, no masses or organomegaly no rebound tenderness noted bowel sounds normal no abdominal bruits no pulsatile masses Back exam - No CVA tenderness Neurological - alert, oriented, normal speech, no movement disorder noted. Slight numbness in fingers of right hand. Musculoskeletal - appropriate ROM in upper and lower extremities. Appropriate hand and finger strength. Extremities - +1 edema bilaterally Skin - normal coloration and turgor, no rashes, no suspicious skin lesions noted  Labs:  Filed Vitals:   07/11/15 1404 07/11/15 1413  BP: 137/68 137/81  Pulse: 54 81  Resp: 18 18    Results for orders placed or performed in visit on 07/11/15 (from the past 24 hour(s))  POCT urinalysis dip (device)   Collection Time: 07/11/15 9:54 AM  Result Value Ref Range   Glucose, UA NEGATIVE NEGATIVE mg/dL   Bilirubin Urine NEGATIVE  NEGATIVE   Ketones, ur NEGATIVE NEGATIVE mg/dL   Specific Gravity, Urine <=1.005 1.005 - 1.030   Hgb urine dipstick NEGATIVE NEGATIVE   pH 6.0 5.0 - 8.0   Protein, ur 30 (A) NEGATIVE mg/dL   Urobilinogen, UA 0.2 0.0 - 1.0 mg/dL   Nitrite NEGATIVE NEGATIVE   Leukocytes, UA NEGATIVE NEGATIVE   Imaging Studies:   Imaging Results    No results  found.   Assessment/Plan Patient Active Problem List   Diagnosis Date Noted  . Chlamydia infection affecting pregnancy, antepartum 07/04/2015  . Supervision of high-risk pregnancy 06/24/2015  . Gestational diabetes mellitus (GDM) 06/24/2015  . AMA (advanced maternal age) multigravida 35+ 06/24/2015  . Rubella non-immune status, antepartum 06/24/2015  . Previous cesarean delivery affecting pregnancy, antepartum 06/24/2015  - Chronic hypertension - Decreased FM w/ BPP of 4/10     1. Chlamydia - positive chlamydia on G/C on 07/04/15. Pt did not take azithromycin. Repeat G/C and treat appropriately. 2.GDM - counsel pt on using prescribed glyburide. Monitor CBG. 3.Rubella non-immune status - MMR vaccine pp. 4. Previous c/s x2 - c/s scheduled 07/26/15 5. Chronic hypertension - draw baseline labs for pre-E: LFT, protein creatinine ratio. Consider procardia. 6. Decreased FM w/ BPP 4/10 -- Repeat BPP and NST.

## 2015-07-11 NOTE — Progress Notes (Signed)
Pt never took Azithromycin as instructed.

## 2015-07-11 NOTE — Op Note (Signed)
Brittany Holt PROCEDURE DATE: 07/11/2015  PREOPERATIVE DIAGNOSIS: Intrauterine pregnancy at  7823w1d weeks gestation; non-reassuring fetal status  POSTOPERATIVE DIAGNOSIS: The same  PROCEDURE:     Cesarean Section  SURGEON:  Dr. Catalina AntiguaPeggy Cletus Paris  ASSISTANT: Dr. Ashok PallWouk  INDICATIONS: Brittany Holt is a 40 y.o. [id number] at 6123w1d scheduled for cesarean section secondary to non-reassuring fetal status.  The risks of cesarean section discussed with the patient included but were not limited to: bleeding which may require transfusion or reoperation; infection which may require antibiotics; injury to bowel, bladder, ureters or other surrounding organs; injury to the fetus; need for additional procedures including hysterectomy in the event of a life-threatening hemorrhage; placental abnormalities wth subsequent pregnancies, incisional problems, thromboembolic phenomenon and other postoperative/anesthesia complications. The patient concurred with the proposed plan, giving informed written consent for the procedure.    FINDINGS:  Viable female infant in cephalic presentation.  Weight, 8 pounds and 5 ounces.  Clear amniotic fluid.  Intact placenta, three vessel cord.  Normal uterus, fallopian tubes and ovaries bilaterally.  ANESTHESIA:    Spinal INTRAVENOUS FLUIDS:2400 ml ESTIMATED BLOOD LOSS: 800 ml URINE OUTPUT:  300 ml SPECIMENS: Placenta sent to pathology COMPLICATIONS: None immediate  PROCEDURE IN DETAIL:  The patient received intravenous antibiotics and had sequential compression devices applied to her lower extremities while in the preoperative area.  She was then taken to the operating room where anesthesia was induced and was found to be adequate. A foley catheter was placed into her bladder and attached to Brittany Holt gravity. She was then placed in a dorsal supine position with a leftward tilt, and prepped and draped in a sterile manner. After an adequate timeout was performed, a Pfannenstiel skin  incision was made with scalpel and carried through to the underlying layer of fascia. The fascia was incised in the midline and this incision was extended bilaterally using the Mayo scissors. Kocher clamps were applied to the superior aspect of the fascial incision and the underlying rectus muscles were dissected off bluntly. A similar process was carried out on the inferior aspect of the facial incision. The rectus muscles were separated in the midline bluntly and the peritoneum was entered bluntly. The Alexis self-retaining retractor was introduced into the abdominal cavity. Attention was turned to the lower uterine segment where a bladder flap was created, and a transverse hysterotomy was made with a scalpel and extended bilaterally bluntly. The infant was successfully delivered, and cord was clamped and cut and infant was handed over to awaiting neonatology team. Uterine massage was then administered and the placenta delivered intact with three-vessel cord. The uterus was cleared of clot and debris.  The hysterotomy was closed with 0 Vicryl in a running locked fashion, and an imbricating layer was also placed with a 0 Vicryl. Overall, excellent hemostasis was noted. The pelvis copiously irrigated and cleared of all clot and debris. Hemostasis was confirmed on all surfaces.  The peritoneum and the muscles were reapproximated using 0 vicryl interrupted stitches. The fascia was then closed using 0 Vicryl in a running fashion.  The subcutaneous layer was reapproximated with plain gut and the skin was closed in a subcuticular fashion using 3.0 Vicryl. The patient tolerated the procedure well. Sponge, lap, instrument and needle counts were correct x 2. She was taken to the recovery room in stable condition.    Smiley Birr,PEGGYMD  07/11/2015 6:26 PM

## 2015-07-11 NOTE — Anesthesia Preprocedure Evaluation (Signed)
Anesthesia Evaluation  Patient identified by MRN, date of birth, ID band Patient awake    Reviewed: Allergy & Precautions, NPO status , Patient's Chart, lab work & pertinent test results  Airway Mallampati: III  TM Distance: >3 FB Neck ROM: Full    Dental  (+) Teeth Intact   Pulmonary neg pulmonary ROS,    breath sounds clear to auscultation       Cardiovascular negative cardio ROS   Rhythm:Regular Rate:Normal     Neuro/Psych negative neurological ROS  negative psych ROS   GI/Hepatic negative GI ROS, Neg liver ROS,   Endo/Other  negative endocrine ROS  Renal/GU negative Renal ROS  negative genitourinary   Musculoskeletal negative musculoskeletal ROS (+)   Abdominal   Peds negative pediatric ROS (+)  Hematology negative hematology ROS (+)   Anesthesia Other Findings   Reproductive/Obstetrics (+) Pregnancy                             Lab Results  Component Value Date   WBC 10.9* 07/11/2015   HGB 12.0 07/11/2015   HCT 35.4* 07/11/2015   MCV 79.9 07/11/2015   PLT 226 07/11/2015   Lab Results  Component Value Date   CREATININE 0.88 07/11/2015   BUN 15 07/11/2015   NA 135 07/11/2015   K 4.5 07/11/2015   CL 111 07/11/2015   CO2 18* 07/11/2015   No results found for: INR, PROTIME   Anesthesia Physical Anesthesia Plan  ASA: III  Anesthesia Plan: Combined Spinal and Epidural   Post-op Pain Management:    Induction:   Airway Management Planned:   Additional Equipment:   Intra-op Plan:   Post-operative Plan:   Informed Consent: I have reviewed the patients History and Physical, chart, labs and discussed the procedure including the risks, benefits and alternatives for the proposed anesthesia with the patient or authorized representative who has indicated his/her understanding and acceptance.   Dental advisory given  Plan Discussed with: CRNA  Anesthesia Plan  Comments:         Anesthesia Quick Evaluation

## 2015-07-11 NOTE — Transfer of Care (Signed)
Immediate Anesthesia Transfer of Care Note  Patient: Brittany Holt  Procedure(s) Performed: Procedure(s): CESAREAN SECTION (N/A)  Patient Location: PACU  Anesthesia Type:Spinal  Level of Consciousness: awake  Airway & Oxygen Therapy: Patient Spontanous Breathing  Post-op Assessment: Report given to RN  Post vital signs: Reviewed and stable  Last Vitals:  Filed Vitals:   07/11/15 1703 07/11/15 1704  BP: 139/68   Pulse: 59 59  Resp:      Complications: No apparent anesthesia complications

## 2015-07-11 NOTE — Anesthesia Postprocedure Evaluation (Signed)
Anesthesia Post Note  Patient: Brittany Holt  Procedure(s) Performed: Procedure(s) (LRB): CESAREAN SECTION (N/A)  Patient location during evaluation: PACU Anesthesia Type: Spinal Level of consciousness: awake Pain management: pain level controlled Vital Signs Assessment: post-procedure vital signs reviewed and stable Respiratory status: spontaneous breathing Cardiovascular status: stable Postop Assessment: no headache, no backache, spinal receding, patient able to bend at knees, no signs of nausea or vomiting and adequate PO intake Anesthetic complications: no Comments: CVC d/c tip intact under sterile conditions after PIV access obtained.    Last Vitals:  Filed Vitals:   07/11/15 2030 07/11/15 2100  BP: 115/61   Pulse: 59 65  Resp:      Last Pain: There were no vitals filed for this visit.               Stepfanie Yott JR,JOHN Susann GivensFRANKLIN

## 2015-07-11 NOTE — Progress Notes (Signed)
Subjective:  Brittany Holt is a 40 y.o. [id number] at 20w1dbeing seen today for ongoing prenatal care.  She is currently monitored for the following issues for this high-risk pregnancy and has Supervision of high-risk pregnancy; Gestational diabetes mellitus (GDM); AMA (advanced maternal age) multigravida 35+; Rubella non-immune status, antepartum; Previous cesarean delivery affecting pregnancy, antepartum; and Chlamydia infection affecting pregnancy, antepartum on her problem list.  Patient reports no complaints.  Contractions: Not present. Vag. Bleeding: None.  Movement: Present. Denies leaking of fluid.   The following portions of the patient's history were reviewed and updated as appropriate: allergies, current medications, past family history, past medical history, past social history, past surgical history and problem list. Problem list updated.  Objective:   Filed Vitals:   07/11/15 0908  BP: 158/78  Pulse: 65  Temp: 97.5 F (36.4 C)  Weight: 207 lb 4.8 oz (94.031 kg)    Fetal Status: Fetal Heart Rate (bpm): 131   Movement: Present     General:  Alert, oriented and cooperative. Patient is in no acute distress.  Skin: Skin is warm and dry. No rash noted.   Cardiovascular: Normal heart rate noted  Respiratory: Normal respiratory effort, no problems with respiration noted  Abdomen: Soft, gravid, appropriate for gestational age. Pain/Pressure: Present     Pelvic: Vag. Bleeding: None     Cervical exam deferred        Extremities: Normal range of motion.  Edema: None  Mental Status: Normal mood and affect. Normal behavior. Normal judgment and thought content.   Urinalysis: Urine Protein: 1+ Urine Glucose: Negative FBS 90-149 all are out of range 2 hour pp 89-238 (13 of 24 are out of range) Assessment and Plan:  Pregnancy: [id number] 340w1d1. Rubella non-immune status, antepartum Will need MMR pp  2. AMA (advanced maternal age) multigravida 3522+third trimester 2x/wk testing  given age and GA - Fetal nonstress test  3. Supervision of high-risk pregnancy, third trimester Continue  prenatal care.   4. Gestational diabetes mellitus (GDM) in third trimester, gestational diabetes method of control unspecified Poor control - Add oral agent 2x/wk testing - glyBURIDE (DIABETA) 2.5 MG tablet; Take 1 tablet (2.5 mg total) by mouth 2 (two) times daily with a meal. (Patient not taking: Reported on 07/11/2015)  Dispense: 60 tablet; Refill: 3 - Fetal nonstress test  5. Chlamydia infection affecting pregnancy, antepartum, third trimester Never treated - will need treatment re-sent - azithromycin (ZITHROMAX) 500 MG tablet; Take 2 tablets (1,000 mg total) by mouth once. (Patient not taking: Reported on 07/11/2015)  Dispense: 2 tablet; Refill: 1  6. Non-reactive NST (non-stress test) For BPP today  Term labor symptoms and general obstetric precautions including but not limited to vaginal bleeding, contractions, leaking of fluid and fetal movement were reviewed in detail with the patient. Please refer to After Visit Summary for other counseling recommendations.  Return in 1 week (on 07/18/2015) for OB visit and NST, NST only in 3-4 days.   TaDonnamae JudeMD

## 2015-07-11 NOTE — Progress Notes (Signed)
Pt sent to MFM for stat BPP due to NR NST

## 2015-07-11 NOTE — Progress Notes (Signed)
CXR for placement of Central Line. Dr Arby BarretteHatchett in to review. Will try to start peripheral IV by CRNA. Pt stable, alert and understanding of plan of care. pts husband at bedside.

## 2015-07-11 NOTE — Anesthesia Procedure Notes (Addendum)
Central Venous Catheter Insertion Performed by: anesthesiologist 07/11/2015 4:00 PM Patient location: Pre-op. Preanesthetic checklist: patient identified, IV checked, site marked, risks and benefits discussed, surgical consent, monitors and equipment checked, pre-op evaluation, timeout performed and anesthesia consent Position: Trendelenburg Lidocaine 1% used for infiltration Landmarks identified and Seldinger technique used Catheter size: 7 Fr Central line was placed.Triple lumen Procedure performed using ultrasound guided technique. Attempts: 1 Following insertion, dressing applied, line sutured and Biopatch. Post procedure assessment: blood return through all ports. Patient tolerated the procedure well with no immediate complications.   Epidural Patient location during procedure: OB Start time: 07/11/2015 5:20 PM End time: 07/11/2015 5:25 PM  Staffing Anesthesiologist: Shona SimpsonHOLLIS, Burna Atlas D Performed by: anesthesiologist   Preanesthetic Checklist Completed: patient identified, site marked, surgical consent, pre-op evaluation, timeout performed, IV checked, risks and benefits discussed and monitors and equipment checked  Epidural Patient position: sitting Prep: ChloraPrep Patient monitoring: heart rate, continuous pulse ox and blood pressure Approach: midline Location: L3-L4 Injection technique: LOR saline  Needle:  Needle type: Tuohy  Needle gauge: 17 G Needle length: 9 cm Catheter type: closed end flexible Catheter size: 20 Guage Test dose: negative and 1.5% lidocaine  Assessment Events: blood not aspirated, injection not painful, no injection resistance and no paresthesia  Additional Notes LOR with Tuohy, spinal needle passed through Tuohy, intrathecal dose administered, spinal needle removed. Catheter advanced without resistance.   LOR @ 7.5  Patient identified. Risks/Benefits/Options discussed with patient including but not limited to bleeding, infection, nerve  damage, paralysis, failed block, incomplete pain control, headache, blood pressure changes, nausea, vomiting, reactions to medications, itching and postpartum back pain. Confirmed with bedside nurse the patient's most recent platelet count. Confirmed with patient that they are not currently taking any anticoagulation, have any bleeding history or any family history of bleeding disorders. Patient expressed understanding and wished to proceed. All questions were answered. Sterile technique was used throughout the entire procedure. Please see nursing notes for vital signs. Test dose was given through epidural catheter and negative prior to continuing to dose epidural or start infusion. Warning signs of high block given to the patient including shortness of breath, tingling/numbness in hands, complete motor block, or any concerning symptoms with instructions to call for help. Patient was given instructions on fall risk and not to get out of bed. All questions and concerns addressed with instructions to call with any issues or inadequate analgesia.    Reason for block:procedure for pain

## 2015-07-11 NOTE — Progress Notes (Signed)
Medical Student H & P  Patient ID: Brittany Holt, female   DOB: 03-15-1976, 40 y.o.   MRN: [id number]  Chief Complaint: decreased fetal movement.  Brittany Holt is a 40 y.o. female [phone] with IUP at 44w1dby LMP, complicated by AMA, chronic HTN, c/s x2, and GDM presenting with decreased fetal movement and BPP of 4/10 this morning. She reports decreased +FMs (Patient states that she has only felt the baby move once in the last 24 hours), No LOF, no VB, no blurry vision, headaches or peripheral edema, and RUQ pain. Admits to numbness tingling in right hand. Began PLa Luzat 36 weeks. Only taking PNV.  Dating: By LMP ---> C/S planned for 07/26/15  Obstetrical/Gynecological History: OB History    Gravida Para Term Preterm AB TAB SAB Ectopic Multiple Living   _0 0 2 0 2 0 0 2      Past Medical History: Past Medical History  Diagnosis Date  . Hypertension   . Diabetes mellitus without complication ([facility]     Past Surgical History: Past Surgical History  Procedure Laterality Date  . Cesarean section      x 2    Family History: Family History  Problem Relation Age of Onset  . Diabetes Father   . Heart disease Father   . Hypertension Father   . Hypertension Mother     Social History: Social History  Substance Use Topics  . Smoking status: Never Smoker   . Smokeless tobacco: Never Used  . Alcohol Use: No    Allergies: No Known Allergies  Prescriptions prior to admission  Medication Sig Dispense Refill Last Dose  . azithromycin (ZITHROMAX) 500 MG tablet Take 2 tablets (1,000 mg total) by mouth once. 2 tablet 1   . glyBURIDE (DIABETA) 2.5 MG tablet Take 1 tablet (2.5 mg total) by mouth 2 (two) times daily with a meal. 60 tablet 3   . Prenatal Vit-Fe Fumarate-FA (PRENATAL VITAMINS) 28-0.8 MG TABS Take by mouth.   Taking   ROS: See PE   Physical Exam   Blood pressure  137/68, pulse 54, height _1  (1.499 m), weight 93.895 kg (207 lb), last menstrual period 10/24/2014.  General: General appearance - alert, well appearing, and in no distress, oriented to person, place, and time, overweight and acyanotic, in no respiratory distress Mental status - alert, oriented to person, place, and time, normal mood, behavior, speech, dress, motor activity, and thought processes. Denies headache Ears - bilateral TM's and external ear canals normal, not examined Eyes - denies blurry vision or seeing spots Mouth - poor dentition, gold teeth Chest - clear to auscultation, no wheezes, rales or rhonchi, symmetric air entry Heart - normal rate, regular rhythm, normal S1, S2, no murmurs, rubs, clicks or gallops Abdomen - soft, nontender, nondistended, no masses or organomegaly no rebound tenderness noted bowel sounds normal no abdominal bruits no pulsatile masses Back exam - No CVA tenderness Neurological - alert, oriented, normal speech, no movement disorder noted. Slight numbness in fingers of right hand. Musculoskeletal - appropriate ROM in upper and lower extremities. Appropriate hand and finger strength. Extremities - +1 edema bilaterally Skin - normal coloration and turgor, no rashes, no suspicious skin lesions noted  Labs:  Filed Vitals:   07/11/15 1404 07/11/15 1413  BP: 137/68 137/81  Pulse: 54 81  Resp: 18 18    Results for orders placed or performed in visit on 07/11/15 (from the past 24 hour(s))  POCT urinalysis dip (device)  Collection Time: 07/11/15 9:54 AM  Result Value Ref Range   Glucose, UA NEGATIVE NEGATIVE mg/dL   Bilirubin Urine NEGATIVE NEGATIVE   Ketones, ur NEGATIVE NEGATIVE mg/dL   Specific Gravity, Urine <=1.005 1.005 - 1.030   Hgb urine dipstick NEGATIVE NEGATIVE   pH 6.0 5.0 - 8.0   Protein, ur 30 (A) NEGATIVE mg/dL   Urobilinogen, UA 0.2 0.0 - 1.0 mg/dL   Nitrite NEGATIVE NEGATIVE    Leukocytes, UA NEGATIVE NEGATIVE   Imaging Studies:   Imaging Results    No results found.   Assessment/Plan Patient Active Problem List   Diagnosis Date Noted  . Chlamydia infection affecting pregnancy, antepartum 07/04/2015  . Supervision of high-risk pregnancy 06/24/2015  . Gestational diabetes mellitus (GDM) 06/24/2015  . AMA (advanced maternal age) multigravida 35+ 06/24/2015  . Rubella non-immune status, antepartum 06/24/2015  . Previous cesarean delivery affecting pregnancy, antepartum 06/24/2015  - Chronic hypertension - Decreased FM w/ BPP of 4/10     1. Chlamydia - positive chlamydia on G/C on 07/04/15. Pt did not take azithromycin. Repeat G/C and treat appropriately. 2.GDM - counsel pt on using prescribed glyburide. Monitor CBG. 3.Rubella non-immune status - MMR vaccine pp. 4. Previous c/s x2 - c/s scheduled 07/26/15 5. History gestational hypertension - here w/ initial bp elevated draw baseline labs for pre-E: LFT, protein creatinine ratio. Consider procardia. 6. Decreased FM w/ BPP 4/10 -- Repeat BPP and NST  OB FELLOW MEDICAL STUDENT NOTE ATTESTATION  I have seen and examined this patient. Note this is a Careers information officer note and as such does not necessarily reflect the patient's plan of care. Please see h & p note for this date of service.    Patsy Lager Wouk 07/11/2015, 4:09 PM

## 2015-07-11 NOTE — Progress Notes (Signed)
Anes in to place central line.

## 2015-07-12 DIAGNOSIS — IMO0002 Reserved for concepts with insufficient information to code with codable children: Secondary | ICD-10-CM

## 2015-07-12 LAB — CBC
HEMATOCRIT: 27.1 % — AB (ref 36.0–46.0)
Hemoglobin: 9.1 g/dL — ABNORMAL LOW (ref 12.0–15.0)
MCH: 26.7 pg (ref 26.0–34.0)
MCHC: 33.6 g/dL (ref 30.0–36.0)
MCV: 79.5 fL (ref 78.0–100.0)
Platelets: 151 10*3/uL (ref 150–400)
RBC: 3.41 MIL/uL — ABNORMAL LOW (ref 3.87–5.11)
RDW: 16.7 % — AB (ref 11.5–15.5)
WBC: 9.9 10*3/uL (ref 4.0–10.5)

## 2015-07-12 LAB — HEMOGLOBIN A1C
HEMOGLOBIN A1C: 7 % — AB (ref 4.8–5.6)
Mean Plasma Glucose: 154 mg/dL

## 2015-07-12 LAB — RPR: RPR Ser Ql: NONREACTIVE

## 2015-07-12 MED ORDER — MEASLES, MUMPS & RUBELLA VAC ~~LOC~~ INJ
0.5000 mL | INJECTION | Freq: Once | SUBCUTANEOUS | Status: DC
  Filled 2015-07-12: qty 0.5

## 2015-07-12 NOTE — Anesthesia Postprocedure Evaluation (Deleted)
Anesthesia Post Note  Patient: Brittany Holt  Procedure(s) Performed: Procedure(s) (LRB): CESAREAN SECTION (N/A)  Anesthesia Post Evaluation  Last Vitals:  Filed Vitals:   07/11/15 2320 07/12/15 0514  BP: 115/65 107/44  Pulse: 72 68  Temp: 36.5 C 36.7 C  Resp: 16 16    Last Pain:  Filed Vitals:   07/12/15 0626  PainSc: 0-No pain                 Earlyne Feeser

## 2015-07-12 NOTE — Progress Notes (Signed)
UR chart review completed.  
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
## 2015-07-12 NOTE — Anesthesia Postprocedure Evaluation (Signed)
Anesthesia Post Note  Patient: Brittany Holt  Procedure(s) Performed: Procedure(s) (LRB): CESAREAN SECTION (N/A)  Patient location during evaluation: Mother Baby Anesthesia Type: Spinal Level of consciousness: awake Pain management: satisfactory to patient Vital Signs Assessment: post-procedure vital signs reviewed and stable Respiratory status: spontaneous breathing Cardiovascular status: stable Postop Assessment: no headache, no backache, spinal receding, patient able to bend at knees, no signs of nausea or vomiting and adequate PO intake Anesthetic complications: no    Last Vitals:  Filed Vitals:   07/11/15 2320 07/12/15 0514  BP: 115/65 107/44  Pulse: 72 68  Temp: 36.5 C 36.7 C  Resp: 16 16    Last Pain:  Filed Vitals:   07/12/15 0626  PainSc: 0-No pain                 Tikesha Mort

## 2015-07-12 NOTE — Lactation Note (Signed)
This note was copied from a baby's chart. Lactation Consultation Note  Initial visit made.  Breastfeeding consultation services reviewed with patient.  Mom is pumping currently and obtaining small amounts of colostrum.  Reviewed colostrum and milk coming to volume.  Instructed mom to pump every 3 hours and save any EBM and transport to NICU.  Mom states she has WIC.  [facility]  Encouraged to call with concerns/assist prn.  Patient Name: Brittany Holt Today's Date: 07/12/2015 Reason for consult: Initial assessment;Late preterm infant;NICU baby   Maternal Data    Feeding    LATCH Score/Interventions                      Lactation Tools Discussed/Used WIC Program: Yes Pump Review: Setup, frequency, and cleaning;Milk Storage Initiated by:: RN Date initiated:: 07/11/15   Consult Status Consult Status: Follow-up Date: 07/13/15 Follow-up type: In-patient    Brittany FoleyMOULDEN, Chaim Gatley S 07/12/2015, 5:09 PM

## 2015-07-12 NOTE — Addendum Note (Signed)
Addendum  created 07/12/15 1327 by Randa SpikeMyra D Baillie Mohammad, CRNA   Modules edited: Charges VN

## 2015-07-12 NOTE — Progress Notes (Cosign Needed)
POSTPARTUM PROGRESS NOTE  Post Partum Day 1 Subjective:  Brittany Holt is a 40 y.o. [id number] 7678w1d s/p RLTCS for NRFHT (4/10 BPP).  No acute events overnight.  Pt denies problems with ambulating, voiding or po intake, though patient still has I/O Foley catheter in place.  She denies nausea or vomiting.  Pain is well controlled.  She has not had flatus. She has not had bowel movement.  Lochia Small.   Objective: Blood pressure 107/44, pulse 68, temperature 98 F (36.7 C), temperature source Oral, resp. rate 16, height 4\' 11"  (1.499 m), weight 93.895 kg (207 lb), last menstrual period 10/24/2014, SpO2 97 %, unknown if currently breastfeeding.  Physical Exam:  General: alert, cooperative and no distress Lochia:normal flow Chest: CTAB Heart: RRR no m/r/g Abdomen: soft, nontender Uterine Fundus: firm, 1 cm below umbilicus DVT Evaluation: No calf swelling or tenderness Extremities: no edema   Recent Labs  07/11/15 1425 07/12/15 0550  HGB 12.0 9.1*  HCT 35.4* 27.1*    Assessment/Plan:  ASSESSMENT: Brittany Holt is a 40 y.o. [id number] 178w1d s/p RLTCS for NRFHT.  Plan: POD#1: Monitor patient vitals today. Manage pain with Ibuprofen, acetaminophen, and Percoset as needed. GDM: Ordered fasting CBG this morning and tomorrow morning. gHTN: Monitor blood pressure q2h today.   LOS: 1 day   Lucretia KernJohn Issac Moure 07/12/2015, 7:27 AM

## 2015-07-12 NOTE — Addendum Note (Signed)
Addendum  created 07/12/15 1618 by Algis GreenhouseLinda A Nolyn Swab, CRNA   Modules edited: Anesthesia Flowsheet, Clinical Notes, Notes Section   Clinical Notes:  File: [id number]; File: [id number]   Notes Section:  Delete: [id number]

## 2015-07-13 ENCOUNTER — Encounter (HOSPITAL_COMMUNITY): Payer: Self-pay | Admitting: Family Medicine

## 2015-07-13 LAB — GLUCOSE, CAPILLARY: GLUCOSE-CAPILLARY: 127 mg/dL — AB (ref 65–99)

## 2015-07-13 NOTE — Clinical Documentation Improvement (Addendum)
OB/GYN  Hgb 12.0 on admit down to 9.1, with 800 ml blood loss during Cesarean section, would either of the following diagnosis be appropriate for this admission?  Thank you    Acute Blood Loss Anemia  Other  Clinically Undetermined     Supporting Information:  C Section , EBL 800ml  Monitored:  Repeat CBC   Please exercise your independent, professional judgment when responding. A specific answer is not anticipated or expected.   Thank You,  Lavonda JumboLawanda J Dehlia Kilner Health Information Management  [phone]

## 2015-07-13 NOTE — Clinical Documentation Improvement (Signed)
OB/GYN  Can the diagnosis be provided for BMI of 41.9 if appropriate for this admission?  Thank you      Identify Type - morbid obesity, obesity (link the BMI to condition e.g. morbid obesity with BMI of 47), overweight, with alveolar hypoventilation (Pickwickian Syndrome)  Document etiology of - drug induced (specify drug), due to excess calories, familial, endocrine  Other  Clinically Undetermined    Supporting documentation: anesthesia record   Please exercise your independent, professional judgment when responding. A specific answer is not anticipated or expected.   Thank You,  Lavonda JumboLawanda J Pristine [place] Health Information Management [place] [phone]

## 2015-07-13 NOTE — Progress Notes (Signed)
When admitting patient last night 07/12/2015 around 2200, there was an order to do a fasting blood glucose the following morning. I performed one using the POCT glucometer around 0630 and the reading was 109. Once docked the value never showed up under Results Review. Called CNM [place] Willmann on call and got verbal order to do a fasting blood sugar this morning.

## 2015-07-13 NOTE — Progress Notes (Signed)
Subjective: Postpartum Day 2: Cesarean Delivery Patient reports tolerating PO, + flatus and no problems voiding. Patient's BPS continue to hover in the 103/55 range; patient is asymptomatic. Glucose is 127 this morning. Patient's son is still in NICU. Patient is aware of baby's status and is communicating with his treatment team about prognosis and course of care, though may not fully understand his conditions. The patient denies nausea, vomiting, diarrhea, constipation, SOB, dizziness, visual disturbances, numbness and tingling down the extremities.  Objective: Vital signs in last 24 hours: Temp:  [98 F (36.7 C)-98.4 F (36.9 C)] 98.4 F (36.9 C) (03/22 0547) Pulse Rate:  [60-76] 76 (03/22 0547) Resp:  [16-18] 18 (03/22 0547) BP: (103-126)/(48-60) 103/55 mmHg (03/22 0547) SpO2:  [98 %-99 %] 99 % (03/21 1730)  Physical Exam:  General: alert, cooperative and no distress Lochia: appropriate Uterine Fundus: firm Incision: healing well, no significant drainage, no dehiscence, no significant erythema DVT Evaluation: No evidence of DVT seen on physical exam. Negative Homan's sign. No cords or calf tenderness. No significant calf/ankle edema.   Recent Labs  07/11/15 1425 07/12/15 0550  HGB 12.0 9.1*  HCT 35.4* 27.1*    Assessment/Plan: Status post Cesarean section. Doing well postoperatively.  Continue current care.  MMR has been ordered, but not administered. Ensure MMR is administered prior to discharge. Social work needs to follow-up for visit without family in the room.  Patient still considering contraception methods post-discharge. Discussed with patient her options.    Pasty Spillers 07/13/2015, 7:27 AM

## 2015-07-13 NOTE — Progress Notes (Signed)
CSW contacted MOB's bedside RN to request that she call CSW if she notes that MOB is alone in her room.  CSW will continue to monitor for a time to speak with MOB privately.

## 2015-07-13 NOTE — Lactation Note (Signed)
This note was copied from a baby's chart. Lactation Consultation Note  Follow up visit.  Mom states she is pumping every 3 hours but not obtaining colostrum.  Reassured and instructed to continue with pumping to stimulate milk supply.  Encouraged to call with concerns.  Patient Name: Brittany Holt ZOXWR'UToday's Date: 07/13/2015     Maternal Data    Feeding    LATCH Score/Interventions                      Lactation Tools Discussed/Used     Consult Status      Huston FoleyMOULDEN, Nevin Grizzle S 07/13/2015, 3:30 PM

## 2015-07-14 ENCOUNTER — Other Ambulatory Visit: Payer: Medicaid Other

## 2015-07-14 DIAGNOSIS — O0930 Supervision of pregnancy with insufficient antenatal care, unspecified trimester: Secondary | ICD-10-CM

## 2015-07-14 DIAGNOSIS — Z98891 History of uterine scar from previous surgery: Secondary | ICD-10-CM

## 2015-07-14 LAB — GLUCOSE, CAPILLARY
GLUCOSE-CAPILLARY: 109 mg/dL — AB (ref 65–99)
Glucose-Capillary: 128 mg/dL — ABNORMAL HIGH (ref 65–99)

## 2015-07-14 MED ORDER — IBUPROFEN 600 MG PO TABS: 600.0000 mg | ORAL_TABLET | Freq: Four times a day (QID) | ORAL | Status: AC

## 2015-07-14 MED ORDER — OXYCODONE-ACETAMINOPHEN 5-325 MG PO TABS: 1.0000 | ORAL_TABLET | Freq: Four times a day (QID) | ORAL | Status: AC | PRN

## 2015-07-14 NOTE — Lactation Note (Signed)
This note was copied from a baby's chart. Lactation Consultation Note  Patient Name: Brittany Holt [id number] Date: 07/14/2015 Reason for consult: Follow-up assessment NICU baby at 21 hr of life and mom is set for d/c today. Mom is currently using a DEBP and has an apt for [facility] tomorrow. She declined [place]. Demonstrated how to use the kit as a manual pump. Reviewed pumping frequency, breast changes, hands on pumping, milk handling, and milk storage. She is going to get labels from the NICU when she leaves takes the milk that is pumping now up to baby. She is aware of OP services and support group. She will call as needed for bf help.    Maternal Data    Feeding    LATCH Score/Interventions                      Lactation Tools Discussed/Used     Consult Status Consult Status: Complete Follow-up type: Call as needed    [name] 07/14/2015, 12:28 PM
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
## 2015-07-14 NOTE — Progress Notes (Signed)
CSW attempted to meet with parents to complete assessment, but MOB was in the bathroom.  CSW attempted again an hour later and parents were not in MOB's room, and not at baby's bedside.  CSW will continue to attempt to meet with parents at a later time.
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
## 2015-07-14 NOTE — Discharge Instructions (Signed)

## 2015-07-18 ENCOUNTER — Other Ambulatory Visit: Payer: Medicaid Other

## 2015-07-21 ENCOUNTER — Other Ambulatory Visit: Payer: Medicaid Other

## 2015-07-25 ENCOUNTER — Other Ambulatory Visit: Payer: Medicaid Other

## 2015-07-26 ENCOUNTER — Inpatient Hospital Stay (HOSPITAL_COMMUNITY)
Admission: RE | Admit: 2015-07-26 | Payer: Medicaid Other | Source: Ambulatory Visit | Admitting: Obstetrics & Gynecology

## 2015-07-26 ENCOUNTER — Encounter (HOSPITAL_COMMUNITY): Admission: RE | Payer: Self-pay | Source: Ambulatory Visit

## 2015-07-26 SURGERY — Surgical Case
Anesthesia: Regional | Site: Abdomen

## 2015-08-16 ENCOUNTER — Ambulatory Visit: Payer: Medicaid Other | Admitting: Certified Nurse Midwife

## 2015-09-13 ENCOUNTER — Ambulatory Visit: Payer: Medicaid Other | Admitting: Obstetrics and Gynecology

## 2016-07-27 IMAGING — CR DG CHEST 1V PORT
1 series · 1 of 1 positions shown · non-contrast
Comparison: None.

CLINICAL DATA: Status post central line placement

EXAM:
PORTABLE CHEST 1 VIEW

[view not recorded]
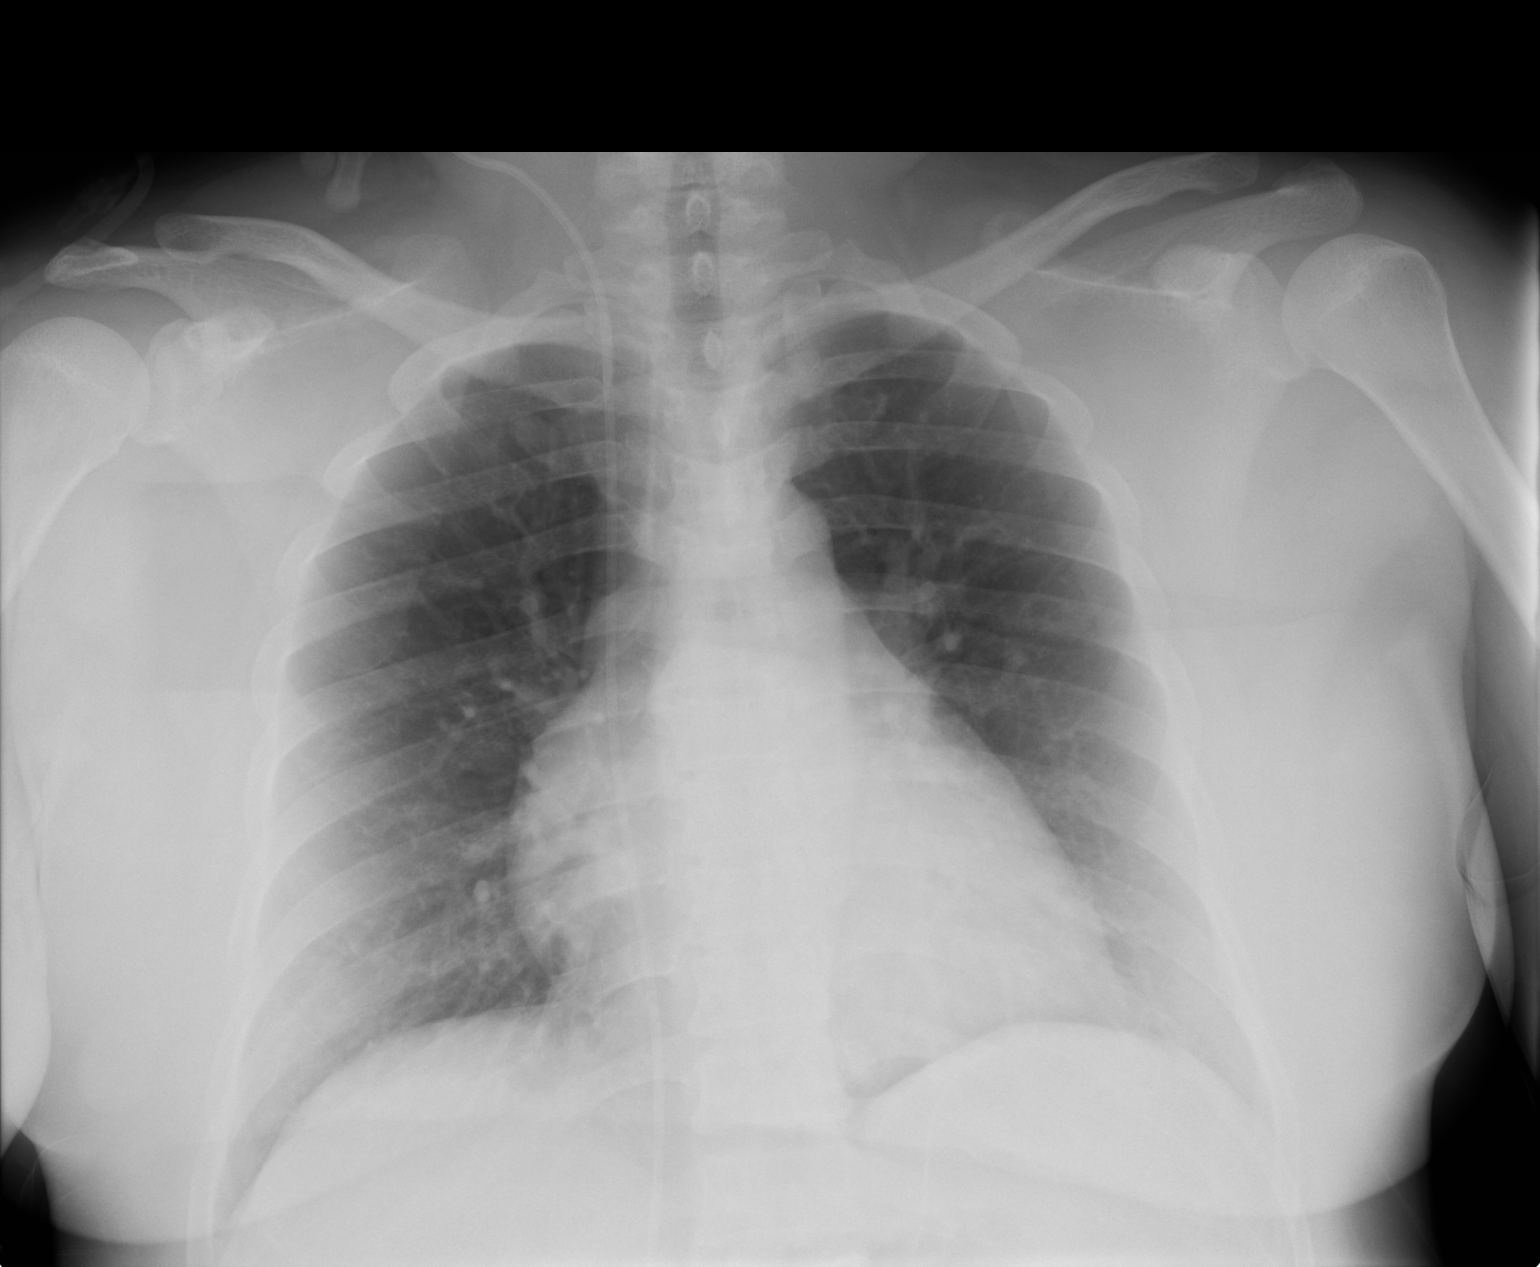

[1 of 1 positions shown; findings below may reference images not displayed]

FINDINGS: Cardiac shadow is within normal limits. The lungs are well aerated
bilaterally. No pneumothorax or focal infiltrate is seen. A the
right jugular central line is noted with the catheter tip deep in
the inferior vena cava in the intrahepatic segment.
IMPRESSION: Central venous line deep in the IVC.

No pneumothorax is noted.

## 2024-02-24 ENCOUNTER — Emergency Department (HOSPITAL_COMMUNITY)
Admission: EM | Admit: 2024-02-24 | Discharge: 2024-02-25 | Discharge status: Against Medical Advice | Payer: Self-pay | Attending: Emergency Medicine | Admitting: Emergency Medicine

## 2024-02-24 ENCOUNTER — Other Ambulatory Visit: Payer: Self-pay

## 2024-02-24 DIAGNOSIS — W460XXA Contact with hypodermic needle, initial encounter: Secondary | ICD-10-CM | POA: Insufficient documentation

## 2024-02-24 DIAGNOSIS — S61234A Puncture wound without foreign body of right ring finger without damage to nail, initial encounter: Secondary | ICD-10-CM | POA: Insufficient documentation

## 2024-02-24 DIAGNOSIS — Z5321 Procedure and treatment not carried out due to patient leaving prior to being seen by health care provider: Secondary | ICD-10-CM | POA: Insufficient documentation

## 2024-02-24 LAB — HEPATITIS PANEL, ACUTE
HCV Ab: NONREACTIVE
Hep A IgM: NONREACTIVE
Hep B C IgM: NONREACTIVE
Hepatitis B Surface Ag: NONREACTIVE

## 2024-02-24 LAB — RAPID HIV SCREEN (HIV 1/2 AB+AG)
HIV 1/2 Antibodies: NONREACTIVE
HIV-1 P24 Antigen - HIV24: NONREACTIVE

## 2024-02-24 NOTE — ED Provider Triage Note (Signed)
 Emergency Medicine Provider Triage Evaluation Note  Brittany Holt , a 48 y.o. female  was evaluated in triage.  Pt complains of needlestick exposure. Reports that she was cleaning the bathroom at [facility] and a needle was in the trash and stuck her ring finger on her right hand. Reports that it was a hypodermic needle. Was told to come here for evaluation.   Review of Systems  Positive:  Negative:   Physical Exam  BP (!) 153/76 (BP Location: Right Arm)   Pulse 71   Temp 98.5 F (36.9 C) (Oral)   Resp 18   Ht 4' 11 (1.499 m)   Wt 78.9 kg   SpO2 97%   BMI 35.14 kg/m  Gen:   Awake, no distress   Resp:  Normal effort  MSK:   Moves extremities without difficulty  Other:    Medical Decision Making  Medically screening exam initiated at 5:51 PM.  Appropriate orders placed.  Brittany Holt was informed that the remainder of the evaluation will be completed by another provider, this initial triage assessment does not replace that evaluation, and the importance of remaining in the ED until their evaluation is complete.     Nora Lauraine LABOR, PA-C 02/24/24 1754

## 2024-02-24 NOTE — ED Triage Notes (Addendum)
 Pt states she was cleaning at work and was poked by  needle on 4th digit of R hand

## 2024-02-25 NOTE — ED Notes (Signed)
 Pt name called for updated vitals, no response
